# Patient Record
Sex: Female | Born: 1965 | Race: Black or African American | Hispanic: No | Smoking: Never smoker
Health system: Southern US, Community
[De-identification: ages and names within clinical notes are randomized; demographics above are authoritative.]

## PROBLEM LIST (undated history)

## (undated) DIAGNOSIS — F419 Anxiety disorder, unspecified: Secondary | ICD-10-CM

## (undated) DIAGNOSIS — I340 Nonrheumatic mitral (valve) insufficiency: Secondary | ICD-10-CM

## (undated) DIAGNOSIS — R002 Palpitations: Secondary | ICD-10-CM

## (undated) HISTORY — DX: Palpitations: R00.2

## (undated) HISTORY — DX: Nonrheumatic mitral (valve) insufficiency: I34.0

## (undated) HISTORY — DX: Anxiety disorder, unspecified: F41.9

## (undated) HISTORY — PX: AUGMENTATION MAMMAPLASTY: SUR837

---

## 2004-06-30 ENCOUNTER — Inpatient Hospital Stay (HOSPITAL_COMMUNITY): Admission: AD | Admit: 2004-06-30 | Discharge: 2004-07-02 | Payer: Self-pay | Admitting: Obstetrics and Gynecology

## 2004-10-19 ENCOUNTER — Other Ambulatory Visit: Admission: RE | Admit: 2004-10-19 | Discharge: 2004-10-19 | Payer: Self-pay | Admitting: Obstetrics and Gynecology

## 2007-08-22 ENCOUNTER — Observation Stay (HOSPITAL_COMMUNITY): Admission: EM | Admit: 2007-08-22 | Discharge: 2007-08-23 | Payer: Self-pay | Admitting: Emergency Medicine

## 2007-08-22 ENCOUNTER — Encounter (INDEPENDENT_AMBULATORY_CARE_PROVIDER_SITE_OTHER): Payer: Self-pay | Admitting: Internal Medicine

## 2007-09-10 ENCOUNTER — Emergency Department (HOSPITAL_COMMUNITY): Admission: EM | Admit: 2007-09-10 | Discharge: 2007-09-10 | Payer: Self-pay | Admitting: Emergency Medicine

## 2007-09-15 ENCOUNTER — Encounter (INDEPENDENT_AMBULATORY_CARE_PROVIDER_SITE_OTHER): Payer: Self-pay | Admitting: Family Medicine

## 2007-09-15 ENCOUNTER — Ambulatory Visit: Payer: Self-pay

## 2007-12-01 ENCOUNTER — Other Ambulatory Visit: Admission: RE | Admit: 2007-12-01 | Discharge: 2007-12-01 | Payer: Self-pay | Admitting: Obstetrics and Gynecology

## 2008-05-04 ENCOUNTER — Encounter: Admission: RE | Admit: 2008-05-04 | Discharge: 2008-05-04 | Payer: Self-pay | Admitting: Obstetrics and Gynecology

## 2009-05-10 ENCOUNTER — Other Ambulatory Visit: Admission: RE | Admit: 2009-05-10 | Discharge: 2009-05-10 | Payer: Self-pay | Admitting: Obstetrics and Gynecology

## 2009-08-23 ENCOUNTER — Encounter: Admission: RE | Admit: 2009-08-23 | Discharge: 2009-08-23 | Payer: Self-pay | Admitting: Family Medicine

## 2009-08-25 ENCOUNTER — Encounter: Admission: RE | Admit: 2009-08-25 | Discharge: 2009-08-25 | Payer: Self-pay | Admitting: Obstetrics and Gynecology

## 2009-08-31 ENCOUNTER — Encounter: Admission: RE | Admit: 2009-08-31 | Discharge: 2009-08-31 | Payer: Self-pay | Admitting: Obstetrics and Gynecology

## 2009-09-11 ENCOUNTER — Telehealth (INDEPENDENT_AMBULATORY_CARE_PROVIDER_SITE_OTHER): Payer: Self-pay | Admitting: *Deleted

## 2009-09-12 ENCOUNTER — Ambulatory Visit: Payer: Self-pay | Admitting: Pulmonary Disease

## 2009-09-12 DIAGNOSIS — F411 Generalized anxiety disorder: Secondary | ICD-10-CM

## 2009-09-12 DIAGNOSIS — G4700 Insomnia, unspecified: Secondary | ICD-10-CM | POA: Insufficient documentation

## 2010-02-20 ENCOUNTER — Ambulatory Visit: Payer: Self-pay | Admitting: Cardiovascular Disease

## 2010-03-21 ENCOUNTER — Ambulatory Visit: Payer: Self-pay

## 2010-03-21 ENCOUNTER — Ambulatory Visit (HOSPITAL_COMMUNITY): Admission: RE | Admit: 2010-03-21 | Discharge: 2010-03-21 | Payer: Self-pay | Admitting: Cardiovascular Disease

## 2010-03-21 ENCOUNTER — Encounter: Payer: Self-pay | Admitting: Cardiovascular Disease

## 2010-03-21 ENCOUNTER — Ambulatory Visit: Payer: Self-pay | Admitting: Cardiology

## 2010-06-12 NOTE — Progress Notes (Signed)
Summary: FYI RE: NEW CONSULT SCHED'D FOR TOMORROW  Phone Note Call from Patient Call back at Home Phone [phone]   Caller: Patient Call For: [facility] Summary of Call: Grisel Blumenstock, THIS WOMAN CALLED TO SCHEDULE THE FIRST AVAIL. CONSULT FOR SLEEP. SHE IS SCHEDULED TO SEE KC TOMORROW. I ASKED IF SHE COULD CALL HER PCP AND HAVE OV/ HX, ETC FAXED TO [place]. SHE SAYS THAT SHE SEES DR Clyde Canterbury AT [facility] ON [address]. HOWEVER, SHE DIDN'T WANT TO CALL THEM AS SHE SAYS HER DR DIDN'T THINK SHE NEEDS TO SEE A SLEEP SPECIALIST; THINKS HER SLEEP ISSUES ARE "ALL IN HER HEAD". JUST AN FYI FOR YOU AND KC. PT SAYS THAT SHE WAKES UP DURING THE NIGHT WITH HER HEART "RACING" AND HAS TROUBLE GETTING BACK TO SLEEP.   Initial call taken by: Tivis Ringer, CNA,  Sep 11, 2009 3:27 PM  Follow-up for Phone Call        noted.  will request records.  Arman Filter LPN  Sep 12, 979 3:53 PM

## 2010-06-12 NOTE — Assessment & Plan Note (Signed)
Summary: self referral for poor sleep hygeine, insomnia   Primary Provider/Referring Provider:  Dr. Clyde Canterbury  CC:  self referral.  History of Present Illness: the pt is a 45y/o female who comes in today for evaluation of sleeping issues.  She has had issues with sleep onset and maintenance for at least 2 years, and feels it is worsening.  She typically goes to bed btw 10pm-66mn, and hesitates to go to bed even when she is sleepy due to frustration of initiating sleep.  She uses a computer late at night to do work, and uses laptop even while in bed!!.  She also notes that her husband watches tv in bed late into the night, and gets up a lot to go to bathroom and to eat/drink.  The pt also notes chronic neck/shoulder/back pain at night that interferes with sleep as well.  She can typically get to sleep within one hour of trying, but awakens most nights within one hour of going to sleep.  She then is awake anywhere from to hours.  When asked why she thinks she cannot go back to sleep, she comments "body goes into anxiety".  She typically will stay in bed if she cannot re-initiate sleep.  She denies any snoring or RLS symptoms.  She does note significant sleep pressure during the day, and sometimes takes naps.  Her epworth score today is 11.  She drinks one cup of coffee a day, and has caffeinated soda for lunch.  She states that she may have caffeine as late as 8pm.  Current Medications (verified): 1)  Clonazepam 0.5 Mg Tbdp (Clonazepam) .... 1/2 Tab As Needed 2)  Omeprazole 40 Mg Cpdr (Omeprazole) .... As Needed  Allergies (verified): No Known Drug Allergies  Past History:  Past Medical History: ANXIETY (ICD-300.00)  Past Surgical History: none  Family History: Reviewed history and no changes required. 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  The patient denies shortness of breath with activity, productive cough, non-productive cough, coughing up blood, chest pain, irregular heartbeats, acid heartburn, indigestion, loss of appetite, weight change, abdominal pain, difficulty swallowing, sore throat, tooth/dental problems, headaches, nasal congestion/difficulty breathing through nose, sneezing, itching, ear ache, depression, joint stiffness or pain, rash, change in color of mucus, and fever.    Vital Signs:  Patient profile:   45 year old female Height:      66 inches Weight:      166.13 pounds BMI:     26.91 O2 Sat:      98 % on Room air Temp:     98.4 degrees F oral Pulse rate:   87 / minute BP sitting:   106 / 60  (right arm) Cuff size:   regular  Vitals Entered By: Gweneth Dimitri RN (Sep 12, 2009 9:24 AM)  O2 Flow:  Room air CC: self referral Comments Medications reviewed with patient Daytime contact number verified with patient. Gweneth Dimitri RN  Sep 12, 2009 9:24 AM    Physical Exam  General:  wd female in nad Eyes:  PERRLA and EOMI.   Nose:  turbinate hypertrophy, but patent Mouth:  clear, normal u/p. Neck:  no jvd, tmg, LN Lungs:  totally clear to auscultation Heart:  rrr, no mrg Abdomen:  soft and nontender, bs+ Extremities:  no edema  or cyanosis, pulses intact distally Neurologic:  appears very sleepy, but oriented, moves all four.   Impression & Recommendations:  Problem # 1:  PERSISTENT DISORDER INITIATING/MAINTAINING SLEEP (ICD-307.42)  the pt's main issue is poor sleep hygiene and environmental issues.  I suspect she does not have a nocturnal sleep disorder.  I have had a long discussion with her about going to bed when first getting sleepy, not using computer after 10pm, considering sleeping in a different bedroom if husband disrupts sleep that much, no caffeine after 10am, and finally discussed stimulus control  therapy with her.  I also explained the role of chronic pain in sleep disruption.  She is to work on these things for the next 3 weeks, and let me know of her progress.  Medications Added to Medication List This Visit: 1)  Clonazepam 0.5 Mg Tbdp (Clonazepam) .... 1/2 tab as needed 2)  Omeprazole 40 Mg Cpdr (Omeprazole) .... As needed  Other Orders: New Patient Level IV (29562)  Patient Instructions: 1)  when you get sleepy, go to bed. 2)  no computer after 10pm, and NEVER in bedroom. 3)  talk with primary md about your chronic pain 4)  try sleeping in another room for next 2-3 weeks to avoid sleep disturbance by husband. 5)  no tv watching or reading in bedroom 6)  if you cannot initiate sleep within , leave bedroom to family room until sleepy again 7)  no napping during day 8)  NO caffeine after 10am. 9)  call me in 3 weeks with progress.

## 2010-06-14 ENCOUNTER — Other Ambulatory Visit: Payer: Self-pay | Admitting: Obstetrics and Gynecology

## 2010-06-14 ENCOUNTER — Other Ambulatory Visit (HOSPITAL_COMMUNITY)
Admission: RE | Admit: 2010-06-14 | Discharge: 2010-06-14 | Disposition: A | Discharge status: Home / Self Care | Payer: Private Health Insurance - Indemnity | Source: Ambulatory Visit | Attending: Obstetrics and Gynecology | Admitting: Obstetrics and Gynecology

## 2010-06-14 DIAGNOSIS — Z01419 Encounter for gynecological examination (general) (routine) without abnormal findings: Secondary | ICD-10-CM | POA: Insufficient documentation

## 2010-06-14 DIAGNOSIS — Z113 Encounter for screening for infections with a predominantly sexual mode of transmission: Secondary | ICD-10-CM | POA: Insufficient documentation

## 2010-09-25 NOTE — H&P (Signed)
Rebekah Bates, Rebekah Bates                ACCOUNT NO.:  [id number]   MEDICAL RECORD NO.:  [id number]          PATIENT TYPE:  INP   LOCATION:  0102                         FACILITY[facility]  [facility]   PHYSICIAN:  Della Goo, M.D. DATE OF BIRTH:  [birth year]   DATE OF ADMISSION:  08/22/2007  DATE OF DISCHARGE:                              HISTORY & PHYSICAL   PRIMARY CARE PHYSICIAN:  Unassigned.   CHIEF COMPLAINT:  Chest pain.   HISTORY OF PRESENT ILLNESS:  This is a 45 year old female who presents  to the emergency department with complaints of chest pain off and on for  1 month.  She reports the pain is located under the left breast area and  comes and goes and is worse with exertion.  She reports this a.m. having  pain that awakened her from sleep.  This pain was described as being  chest pressure in the left chest and under the left breast and she rated  the discomfort as being a 4-5/10 at the worst.  She denies having any  shortness of breath, nausea, vomiting, diarrhea or diaphoresis.  She  denies having any cough, fevers, chills..  The patient reports being  relatively healthy, is a nonsmoker, nondrinker, and reports exercising  regularly.  She also reports eating very healthy.   PAST MEDICAL HISTORY:  None.   PAST SURGICAL HISTORY:  None.   MEDICATIONS:  Over-the-counter medications, Tums and iron.   ALLERGIES:  No known drug allergies.   SOCIAL HISTORY:  The patient is a nonsmoker, nondrinker.   FAMILY HISTORY:  Negative for coronary artery disease or diabetes but  positive for hypertension in her maternal grandfather.  No history of  cancer.   REVIEW OF SYSTEMS:  Pertinents are mentioned above.   PHYSICAL EXAMINATION FINDINGS:  A pleasant 45 year old well-nourished,  well-developed female in no acute distress or discomfort at this time.  VITAL SIGNS:  Temperature 98.1, blood pressure 112/61, heart rate 58,  respirations 20, O2 saturations 99-100% on room air.  HEENT:   Normocephalic, atraumatic.  Pupils equally round, reactive to  light.  Extraocular muscles are intact.  Funduscopic benign.  There is  no scleral icterus.  Oropharynx is clear.  NECK:  Supple, full range of motion.  No thyromegaly, adenopathy or  jugular venous distention.  CARDIOVASCULAR:  Bradycardic rate, regular rhythm.  CHEST WALL:  No reproducible chest wall tenderness.  ABDOMEN:  Positive bowel sounds, soft, nontender, nondistended.  No  hepatosplenomegaly.  No rebound.  No guarding.  EXTREMITIES:  Without cyanosis, clubbing or edema.  BACK:  No costovertebral angle tenderness.  No spinous process  tenderness.  EXTREMITIES:  Without cyanosis, clubbing or edema.  NEUROLOGIC:  Alert and oriented x3.  There are no focal deficits.   LABORATORY STUDIES:  White blood cell count 5.5, hemoglobin 12.7,  hematocrit 38.5, platelets 227, neutrophils 32%, lymphocytes 55%.  Sodium 139, potassium 3.9, chloride 107, bicarb 24, BUN 7, creatinine  0.94, and glucose 85.  Calcium level 9.7.  Cardiac enzymes with a  myoglobin of 38.9, CK-MB 1.6, troponin less than 0.05.  EKG reveals a  sinus bradycardia.  No acute ST-segment changes are seen.  Chest x-ray  findings reveal no acute disease process.   ASSESSMENT:  A 45 year old female being admitted with:   1. Chest pain.  2. Sinus bradycardia probably secondary to increased athletic tone.  3. Acute versus chronic viral syndrome.   PLAN:  The patient will be admitted to a telemetry area for cardiac  monitoring.  Cardiac enzymes will be performed.  The patient will be  placed on nitroglycerin paste topically and oxygen therapy along with  aspirin therapy.  The patient is bradycardic at this time so beta  blocker therapy will not be started.  DVT and GI prophylaxis have been  ordered.  A 2-D echo has also been ordered to evaluate for wall  thickness abnormalities and valvular disease.  The patient's CBC will  also be monitored for further  fluctuations in her white blood cell count  and differential.  Further workup will ensue pending the patient's  symptoms and clinical progress.      Della Goo, M.D.  Electronically Signed     HJ/MEDQ  D:  08/22/2007  T:  08/22/2007  Job:  [id number]

## 2010-09-25 NOTE — Discharge Summary (Signed)
Bates, Rebekah                ACCOUNT NO.:  [id number]   MEDICAL RECORD NO.:  [id number]          PATIENT TYPE:  INP   LOCATION:  1404                         [facility]  [facility]   PHYSICIAN:  Eduard Clos, MDDATE OF BIRTH:  1965-10-11   DATE OF ADMISSION:  08/22/2007  DATE OF DISCHARGE:  08/23/2007                               DISCHARGE SUMMARY   HOSPITAL COURSE:  A 45 year old female in the [employer] with a past  medical history of preoperative chest pain.  The patient was admitted to  a telemetry floor.  Serial cardiac enzymes and EKGs were done which were  within normal and it was decided to admit the patient's chest pain which  have resolved .  And at this moment the patient is eager to go home.   Advised the patient that she will need outpatient stress test and a 2D  echo for which she has agreed to follow up with [facility].   At the time of discharge the patient is hemodynamically stable.   DISCHARGE DIAGNOSIS:  Chest pain.   DISCHARGE MEDICATIONS:  None.   PLAN:  The patient was advised to follow with the primary care physician  in a weeks time, to follow up with [facility] in a week's time  and have a 2D echo and stress test done.  In the event of any recurrent  symptoms, the patient is advised to go to ____________.      Eduard Clos, MD  Electronically Signed     ANK/MEDQ  D:  08/23/2007  T:  08/23/2007  Job:  [id number]

## 2010-09-28 NOTE — H&P (Signed)
NAMECLOYCE, Rebekah Bates                ACCOUNT NO.:  [id number]   MEDICAL RECORD NO.:  [id number]          PATIENT TYPE:  INP   LOCATION:  9165                          FACILITY:  [facility]   PHYSICIAN:  Juan H. Lily Peer, M.D.DATE OF BIRTH:  [birth year]   DATE OF ADMISSION:  06/30/2004  DATE OF DISCHARGE:                                HISTORY & PHYSICAL   CHIEF COMPLAINT:  Contractions.   HISTORY OF PRESENT ILLNESS:  The patient is a 45 year old, gravida 3, para  2, with an estimated date of confinement based on early ultrasound to be  July 06, 2004, currently 39-1/7th weeks gestation. She presented to  [facility] complaining of contraction. This morning, she stated that  her contractions actually started around 4:30 this morning. When she  presented to [facility], she was found to be about 2 cm, about 80%, -3  station with irregular contractions. She was monitored for a couple of hours  and her cervix changed from 2 to 5 cm and about 80-90% effaced, and  contractions were more regular every three to four minutes apart with a  reassuring tracing. She was brought in to labor and delivery.   Upon examination, her cervix is now 6 to 7 cm, bulging membrane. She  underwent artificial rupture of membrane. After Pen-G had been started, due  to the fact that she had a history of positive group B strep culture  according to the patient, although documentation could not be found at this  time. So, we took the precaution of starting her on that and an intrauterine  pressure catheter was placed. The amniotic fluid was clear.   PRENATAL COURSE HISTORY:  Significant for the fact that she had declined  genetic amniocentesis due to the fact that she is 45 years of age and she  also did not have alphafetaprotein or cystic fibrosis screen. Otherwise,  uneventful prenatal course. She did not pass her diabetes screen but passed  her three hour GTT.   ALLERGIES:  The patient denies any  allergies.   PAST MEDICAL HISTORY:  She has had two prior vaginal deliveries at term at  [redacted] weeks gestation.   REVIEW OF SYMPTOMS:  See [place] form.   PHYSICAL EXAMINATION:  VITAL SIGNS:  Temperature 98.2, blood pressure  126/72. Contractions every two to four minutes apart with a reassuring fetal  heart rate tracing.  GENERAL:  Well-developed, well-nourished female complaining of contractions.  HEENT:  Unremarkable.  NECK:  Supple. Trachea midline. No carotid bruits. No thyromegaly.  LUNGS:  Clear to auscultation without rhonchi or wheezes.  HEART:  Regular rate and rhythm. No murmurs or gallops.  BREASTS:  Not done.  ABDOMEN:  Gravid uterus.  PELVIC:  Vertex presentation by Thayer Ohm maneuver. Confirmed by pelvic  examination. Cervix 6 to 7 cm dilated, 90% effaced, -2 station. Clear  amniotic fluid upon rupture. Scalp full internal place.  EXTREMITIES:  There is 1+ pitting edema. DTR 1+. Negative clonus.   LABORATORY DATA:  See [place]ister form.   ASSESSMENT:  A 45 year old, gravida 3, para 2, at 39-1/7th week gestation in  active labor with advanced cervical dilatation. Underwent artificial rupture  of membrane after Pen-G had been started due to her past history of group B  strep. The patient fully internalized, does not want an epidural. We will  augment with Pitocin in the event of protracted labor. Reassuring fetal  heart rate tracing.   PLAN:  As per assessment above. Anticipate vaginal delivery.      JHF/MEDQ  D:  06/30/2004  T:  06/30/2004  Job:  [id number]   cc:   Leonette Most A. Sydnee Cabal, MD  Fax: [phone]

## 2011-02-05 LAB — DIFFERENTIAL
Basophils Absolute: 0.1
Basophils Relative: 1
Basophils Relative: 1
Eosinophils Absolute: 0.2
Eosinophils Relative: 4
Lymphocytes Relative: 55 — ABNORMAL HIGH
Monocytes Absolute: 0.5
Monocytes Relative: 9
Neutrophils Relative %: 32 — ABNORMAL LOW
Neutrophils Relative %: 51

## 2011-02-05 LAB — CARDIAC PANEL(CRET KIN+CKTOT+MB+TROPI)
CK, MB: 1.5
Relative Index: 1.1
Troponin I: 0.02
Troponin I: 0.02

## 2011-02-05 LAB — BASIC METABOLIC PANEL
BUN: 7
CO2: 24
Calcium: 9.7
Chloride: 107
Creatinine, Ser: 0.94
GFR calc Af Amer: >60
GFR calc non Af Amer: >60
Glucose, Bld: 76
Sodium: 139

## 2011-02-05 LAB — CBC
HCT: 38.5
Hemoglobin: 12.7
Hemoglobin: 13.2
MCHC: 32.9
MCHC: 32.5
MCV: 80.9
RBC: 5.03
WBC: 5.4

## 2011-02-05 LAB — TSH: TSH: 1.338

## 2011-02-05 LAB — URINALYSIS, ROUTINE W REFLEX MICROSCOPIC
Glucose, UA: NEGATIVE
Hgb urine dipstick: NEGATIVE
Ketones, ur: NEGATIVE
Nitrite: NEGATIVE
Specific Gravity, Urine: 1.009
pH: 7

## 2011-02-05 LAB — POCT CARDIAC MARKERS: Myoglobin, poc: 45.7

## 2012-05-08 ENCOUNTER — Other Ambulatory Visit: Payer: Self-pay | Admitting: Family Medicine

## 2012-05-08 DIAGNOSIS — Z1231 Encounter for screening mammogram for malignant neoplasm of breast: Secondary | ICD-10-CM

## 2012-06-02 ENCOUNTER — Ambulatory Visit
Admission: RE | Admit: 2012-06-02 | Discharge: 2012-06-02 | Disposition: A | Discharge status: Home / Self Care | Payer: BC Managed Care – PPO | Source: Ambulatory Visit | Attending: Family Medicine | Admitting: Family Medicine

## 2012-06-02 DIAGNOSIS — Z1231 Encounter for screening mammogram for malignant neoplasm of breast: Secondary | ICD-10-CM

## 2012-06-04 ENCOUNTER — Other Ambulatory Visit (HOSPITAL_COMMUNITY)
Admission: RE | Admit: 2012-06-04 | Discharge: 2012-06-04 | Disposition: A | Discharge status: Home / Self Care | Payer: BC Managed Care – PPO | Source: Ambulatory Visit | Attending: Obstetrics and Gynecology | Admitting: Obstetrics and Gynecology

## 2012-06-04 ENCOUNTER — Other Ambulatory Visit: Payer: Self-pay | Admitting: Nurse Practitioner

## 2012-06-04 DIAGNOSIS — N760 Acute vaginitis: Secondary | ICD-10-CM | POA: Insufficient documentation

## 2012-06-04 DIAGNOSIS — Z1151 Encounter for screening for human papillomavirus (HPV): Secondary | ICD-10-CM | POA: Insufficient documentation

## 2012-06-04 DIAGNOSIS — Z01419 Encounter for gynecological examination (general) (routine) without abnormal findings: Secondary | ICD-10-CM | POA: Insufficient documentation

## 2013-02-03 ENCOUNTER — Ambulatory Visit
Admission: RE | Admit: 2013-02-03 | Discharge: 2013-02-03 | Disposition: A | Discharge status: Home / Self Care | Payer: BC Managed Care – PPO | Source: Ambulatory Visit | Attending: Family Medicine | Admitting: Family Medicine

## 2013-02-03 ENCOUNTER — Other Ambulatory Visit: Payer: Self-pay | Admitting: Family Medicine

## 2013-02-03 DIAGNOSIS — M542 Cervicalgia: Secondary | ICD-10-CM

## 2013-02-03 DIAGNOSIS — M25552 Pain in left hip: Secondary | ICD-10-CM

## 2013-06-01 ENCOUNTER — Encounter: Payer: Self-pay | Admitting: Podiatry

## 2013-06-01 ENCOUNTER — Ambulatory Visit (INDEPENDENT_AMBULATORY_CARE_PROVIDER_SITE_OTHER): Payer: BC Managed Care – PPO | Admitting: Podiatry

## 2013-06-01 VITALS — BP 120/61 | HR 66 | Resp 17 | Ht 66.0 in | Wt 175.0 lb

## 2013-06-01 DIAGNOSIS — M7750 Other enthesopathy of unspecified foot: Secondary | ICD-10-CM

## 2013-06-01 DIAGNOSIS — M779 Enthesopathy, unspecified: Principal | ICD-10-CM

## 2013-06-01 DIAGNOSIS — M778 Other enthesopathies, not elsewhere classified: Secondary | ICD-10-CM

## 2013-06-01 NOTE — Progress Notes (Signed)
Pt states the right 1st MPJ continues to hurt, worse in enclosed shoes, and standing.  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  Objective: Vital signs are stable she is alert and oriented x3. She has pain on palpation medial aspect of the first metatarsophalangeal joint of the right foot. A small palpable bursa is noted.  Assessment: Hallux abductovalgus with bursitis medial aspect first metatarsophalangeal joint.  Plan: Discussed etiology pathology conservative versus surgical therapies injected today with dexamethasone and local anesthetic to the point of maximal tenderness after sterile Betadine skin prep. I will followup with her on as-needed basis for an injection. I did suggest that she wear wide open toed shoes for at least tennis shoes to work.

## 2013-07-05 ENCOUNTER — Ambulatory Visit: Payer: BC Managed Care – PPO | Admitting: Cardiovascular Disease

## 2013-07-20 ENCOUNTER — Other Ambulatory Visit: Payer: Self-pay | Admitting: Family Medicine

## 2013-07-20 DIAGNOSIS — M542 Cervicalgia: Secondary | ICD-10-CM

## 2013-07-26 ENCOUNTER — Ambulatory Visit
Admission: RE | Admit: 2013-07-26 | Discharge: 2013-07-26 | Disposition: A | Discharge status: Home / Self Care | Payer: BC Managed Care – PPO | Source: Ambulatory Visit | Attending: Family Medicine | Admitting: Family Medicine

## 2013-07-26 DIAGNOSIS — M542 Cervicalgia: Secondary | ICD-10-CM

## 2013-07-29 ENCOUNTER — Ambulatory Visit: Payer: BC Managed Care – PPO | Admitting: Cardiovascular Disease

## 2013-08-02 ENCOUNTER — Encounter: Payer: BC Managed Care – PPO | Admitting: Neurology

## 2013-08-30 ENCOUNTER — Encounter: Payer: Self-pay | Admitting: Cardiovascular Disease

## 2013-08-30 ENCOUNTER — Ambulatory Visit (INDEPENDENT_AMBULATORY_CARE_PROVIDER_SITE_OTHER): Payer: BC Managed Care – PPO | Admitting: Cardiovascular Disease

## 2013-08-30 VITALS — BP 110/80 | HR 72 | Ht 66.0 in | Wt 175.8 lb

## 2013-08-30 DIAGNOSIS — R002 Palpitations: Secondary | ICD-10-CM | POA: Insufficient documentation

## 2013-08-30 NOTE — Assessment & Plan Note (Signed)
Rebekah Bates presents today for further evaluation and management of her palpitations. A lot of her symptoms sound consistent with anxiety. Most of her cardiac issues sound like normal physiology. She may be having some premature ventricular contractions.  I've recommended that she increase her potassium intake including drinking V8 juice, eating more tomatoes, bananas, orange juice, and cannula. We'll also refer her to [facility] for some biofeedback therapy.  I'll see her again in 6 months.

## 2013-08-30 NOTE — Patient Instructions (Addendum)
You're having some palpitations at night. These are most likely benign. One possibility is that you're having premature ventricular contractions. Your  thyroid has already been checked and found to be normal. Another possibility is that you have low potassium. I would suggest increasing your intake of V-8 juice, bananas, tomatoes, potatoes, orange juice.  Call [facility] - [phone] -  Mind / body healing, biofeedback instructor.  Your physician wants you to follow-up in: 6 months with Dr. Elease HashimotoNahser.  You will receive a reminder letter in the mail two months in advance. If you don't receive a letter, please call our office to schedule the follow-up appointment.  Your physician recommends that you continue on your current medications as directed. Please refer to the Current Medication list given to you today.

## 2013-08-30 NOTE — Progress Notes (Signed)
     Rebekah Bates Date of Birth  1965/08/20       [facility]    [place] [facility] [address]  [address] [place], [place]  [address]   [place], [place]  [id number] [phone]     [phone]   Fax  [phone]     Fax [phone]  Problem List: 1. Mild mitral regurgitation 2. Palpitations  History of Present Illness:  Rebekah BattiestCharo was seen several years ago.    She has lots of noncardiac issues - many sound like anxiety She is exercising - 3-4 times a week.   Does well with exercise, no Cp or dyspnea.   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    She has palpitations at night.   She is not sleeping well.  Stays awake for lots of the night.    Current Outpatient Prescriptions on File Prior to Visit  Medication Sig Dispense Refill  . clonazePAM (KLONOPIN) 0.5 MG tablet Take 0.5 mg by mouth as directed.       No current facility-administered medications on file prior to visit.    No Known Allergies  Past Medical History  Diagnosis Date  . Palpitations   . Anxiety   . Mild mitral regurgitation     No past surgical history on file.  History  Smoking status  . Never Smoker   Smokeless tobacco  . Not on file    History  Alcohol Use: Not on file    Family History  Problem Relation Age of Onset  . Hypertension Mother   . Diabetes Father   . Hypertension Father     Reviw of Systems:  Reviewed in the HPI.  All other systems are negative.  Physical Exam: Blood pressure 110/80, pulse 72, height 5\' 6"  (1.676 m), weight 175 lb 12.8 oz (79.742 kg). Wt Readings from Last 3 Encounters:  08/30/13 175 lb 12.8 oz (79.742 kg)  06/01/13 175 lb (79.379 kg)  09/12/09 166 lb 2.1 oz (75.357 kg)     General: Well developed, well nourished, in no acute distress.  Head: Normocephalic, atraumatic, sclera non-icteric, mucus membranes are moist,   Neck: Supple. Carotids are 2 + without bruits. No JVD   Lungs: Clear   Heart: RR, no  significant murmur  Abdomen: Soft, non-tender, non-distended with normal bowel sounds  Msk:  Strength and tone are normal   Extremities: No clubbing or cyanosis. No edema.  Distal pedal pulses are 2+ and equal    Neuro: CN II - XII intact.  Alert and oriented X 3.   Psych:  Normal   ECG: 08/30/2013: Normal sinus rhythm at 72. She has no ST or T wave changes.  Assessment / Plan:

## 2013-12-27 ENCOUNTER — Telehealth: Payer: Self-pay | Admitting: Cardiovascular Disease

## 2013-12-27 NOTE — Telephone Encounter (Signed)
Ok to place a 30 day monitor.  Office visit in 3 months

## 2013-12-27 NOTE — Telephone Encounter (Signed)
New problem:    Per pt saw dr Leticia ClasHussan 8/16 and would like to be see ASAP or may be get a monitor that was suggested?  Pt will have Dr Darlin PriestlyHussan's office fax notes.   Pt would like a call back about the problems she has been having.

## 2013-12-27 NOTE — Telephone Encounter (Signed)
Pt called bach because she states her palpitations are  more frequent now. Pt states she had them during the night; now she has them during the day also. Pt states that she is taking the anxiety medication and she still  having the palpitations. Pt was seen by primary care today, and was recommended to call her cardiology MD to see if she can have a cardiac monitor. Pt is aware that a message will be send to MD for recommendations.

## 2013-12-27 NOTE — Telephone Encounter (Signed)
Left pt a message to call back. 

## 2013-12-29 NOTE — Telephone Encounter (Signed)
Patient aware of appointment dates and times and verbalized agreement and gratitude

## 2013-12-29 NOTE — Telephone Encounter (Signed)
Spoke with patient and advised that 30 day event monitor will be ordered for palpitations per Dr. Elease HashimotoNahser.  Patient agrees to plan.  We discussed dates/times for monitor and I was able to schedule patient for Friday 8/21.  I left message for patient to call back to confirm appointment.  Patient is scheduled to f/u with Dr. Elease HashimotoNahser 12/1.

## 2013-12-29 NOTE — Telephone Encounter (Signed)
Left message for patient to call me at the office today

## 2013-12-31 ENCOUNTER — Encounter (INDEPENDENT_AMBULATORY_CARE_PROVIDER_SITE_OTHER): Payer: BC Managed Care – PPO

## 2013-12-31 ENCOUNTER — Encounter: Payer: Self-pay | Admitting: Radiology

## 2013-12-31 ENCOUNTER — Other Ambulatory Visit: Payer: Self-pay | Admitting: Nurse Practitioner

## 2013-12-31 ENCOUNTER — Encounter: Payer: Self-pay | Admitting: Cardiovascular Disease

## 2013-12-31 DIAGNOSIS — R002 Palpitations: Secondary | ICD-10-CM

## 2013-12-31 NOTE — Progress Notes (Signed)
Patient ID: Rebekah PapCharo L Bates, female   DOB: [birth year], 48 y.o.   MRN: [id number] E cardio 30 day monitor applied EOS 01-30-14

## 2014-01-04 ENCOUNTER — Telehealth: Payer: Self-pay | Admitting: Nurse Practitioner

## 2014-01-04 MED ORDER — METOPROLOL SUCCINATE ER 25 MG PO TB24: 25.0000 mg | ORAL_TABLET | Freq: Every day | ORAL | Status: DC

## 2014-01-04 NOTE — Telephone Encounter (Signed)
Received eCardio report; per Dr. Elease Hashimoto patient having sinus tachycardia and he would like her to start Toprol XL 25 mg once daily.  I called and spoke with patient and advised her of Dr. Harvie Bridge recommendation.  Patient verbalized understanding and agreement.

## 2014-01-10 ENCOUNTER — Telehealth: Payer: Self-pay | Admitting: Cardiovascular Disease

## 2014-01-10 DIAGNOSIS — R002 Palpitations: Secondary | ICD-10-CM

## 2014-01-10 NOTE — Telephone Encounter (Signed)
New message      Patient want doctor to know she was working out wed or thurs (whatever day we called her saying her heart rate was up).  Someone called her and said her heart rate was high.  Since she was working out, she want to make sure medication is needed.  Please call

## 2014-01-10 NOTE — Telephone Encounter (Signed)
Pt calling to ask Dr Elease Hashimoto and nurse if its necessary that she be on the Toprol xl 25 mg daily that was recently started on 8/25.  Pt states she was recently started on Toprol xl 25 mg because of recent monitor notification showing sinus tach.  Pt states she was exercising at the time of the recorded event.  Pt just wants reassurance that she needs to take the Toprol xl 25 mg.  Pt states she does experience periods of "fluttering sensations" in her chest, but that's nothing abnormal.  Pt states she is asymptomatic at this time with no sob, or cp.  Pt would also like Dr Elease Hashimoto to advise if its safe for her to start a exercise regimen with a personal trainer.  Informed pt that Dr Elease Hashimoto and nurse are both out of the office today, but I will route this message to them for further review and recommendation and follow-up thereafter.  Pt verbalized understanding and agrees with this plan.

## 2014-01-11 NOTE — Telephone Encounter (Signed)
She may hold the Toprol if she feels that the episode of tachycardia was just due to exercise,  We can reassess in the future.

## 2014-01-12 ENCOUNTER — Other Ambulatory Visit (INDEPENDENT_AMBULATORY_CARE_PROVIDER_SITE_OTHER): Payer: BC Managed Care – PPO

## 2014-01-12 DIAGNOSIS — R002 Palpitations: Secondary | ICD-10-CM

## 2014-01-12 LAB — BASIC METABOLIC PANEL
BUN: 8 mg/dL (ref 6–23)
CALCIUM: 9.1 mg/dL (ref 8.4–10.5)
CO2: 29 mEq/L (ref 19–32)
CREATININE: 0.8 mg/dL (ref 0.4–1.2)
Chloride: 105 mEq/L (ref 96–112)
GFR: 95.25 mL/min (ref >60.00)
Glucose, Bld: 108 mg/dL — ABNORMAL HIGH (ref 70–99)
Potassium: 3.5 mEq/L (ref 3.5–5.1)
SODIUM: 139 meq/L (ref 135–145)

## 2014-01-12 NOTE — Telephone Encounter (Signed)
Left message for patient to call the office for advice about Toprol

## 2014-01-12 NOTE — Telephone Encounter (Signed)
Received call from patient who states she has been taking Toprol since 8/31.  I discussed Dr. Harvie Bridge advice with her and she states she is concerned about stopping Toprol abruptly.  I advised that patient may take 1/2 tab for a few days and then discontinue.  Patient states she is not certain as to whether the medication has helped with her symptoms - states she feels that exercise increases palpitations.  I advised that it is safe to continue medication, especially as she indicates that she is starting to exercise more frequently.  I advised patient to make certain she remains well hydrated as dehydration can adversely affect symptoms and we discussed diet.  Patient states she is unable to eat/drink many of the potassium-rich foods recommended by Dr. Elease Hashimoto at [facility] in April 2015.  I advised that we need to check a basic metabolic panel for hypokalemia.  Patient scheduled for lab on 9/3.  Patient verbalized understanding and agreement.

## 2014-01-13 ENCOUNTER — Other Ambulatory Visit: Payer: BC Managed Care – PPO

## 2014-01-14 ENCOUNTER — Telehealth: Payer: Self-pay | Admitting: Cardiovascular Disease

## 2014-01-14 NOTE — Telephone Encounter (Signed)
New problem   Pt is having allergic reaction from her Metoprolol and can't wear her lead to her monitor. Please call pt.

## 2014-01-14 NOTE — Telephone Encounter (Signed)
Calling stating that she thinks she is having a reaction to Metoprolol.  States she is having itching around electrodes from monitor she is wearing.  States the itching only started when she started on Metoprolol.  Advised she should call [employer] and have them send her "sensitive electrodes".  Do not feel that Metoprolol is related to electrodes.  Advised to take some benadryl or try using hydrocortisone cream.  She will call [employer] and continue taking Metoprolol as prescribed. She is using calamine lotion for itching. Will call if she has any further problems.

## 2014-01-19 ENCOUNTER — Telehealth: Payer: Self-pay | Admitting: *Deleted

## 2014-01-19 NOTE — Telephone Encounter (Signed)
The monitor shows sinus tach.  She was exercising at the time

## 2014-01-19 NOTE — Telephone Encounter (Signed)
[facility] called triage with serious notification on pts event monitor showing sinus tach at 160-165 bpm.  Contacted the pt and she states she was exercising on the treadmill at the time of recorded event.  Pt denies any cardiac complaints at this time.  [facility] is aware of this as well.  Will forward this to Dr Elease Hashimoto and nurse for their review.

## 2014-01-21 ENCOUNTER — Other Ambulatory Visit: Payer: Self-pay

## 2014-01-21 DIAGNOSIS — Z1231 Encounter for screening mammogram for malignant neoplasm of breast: Secondary | ICD-10-CM

## 2014-02-01 ENCOUNTER — Other Ambulatory Visit: Payer: Self-pay

## 2014-02-01 ENCOUNTER — Ambulatory Visit
Admission: RE | Admit: 2014-02-01 | Discharge: 2014-02-01 | Disposition: A | Discharge status: Home / Self Care | Payer: BC Managed Care – PPO | Source: Ambulatory Visit

## 2014-02-01 DIAGNOSIS — Z1231 Encounter for screening mammogram for malignant neoplasm of breast: Secondary | ICD-10-CM

## 2014-02-03 ENCOUNTER — Telehealth: Payer: Self-pay | Admitting: Nurse Practitioner

## 2014-02-03 NOTE — Telephone Encounter (Signed)
Per Dr. Elease Hashimoto, [employer] monitor results reveal NSR and sinus tachycardia.  I left message for patient to call office for results

## 2014-02-03 NOTE — Telephone Encounter (Signed)
Pt is aware  of [employer] monitor results. She verbalized understanding.

## 2014-02-03 NOTE — Telephone Encounter (Signed)
Follow up ° ° ° ° ° ° ° ° ° °Pt returning nurse call  °

## 2014-03-03 ENCOUNTER — Ambulatory Visit: Payer: BC Managed Care – PPO | Admitting: Cardiovascular Disease

## 2014-04-12 ENCOUNTER — Ambulatory Visit: Payer: BC Managed Care – PPO | Admitting: Cardiovascular Disease

## 2014-04-16 ENCOUNTER — Encounter (HOSPITAL_BASED_OUTPATIENT_CLINIC_OR_DEPARTMENT_OTHER): Payer: Self-pay | Admitting: *Deleted

## 2014-04-16 ENCOUNTER — Emergency Department (HOSPITAL_BASED_OUTPATIENT_CLINIC_OR_DEPARTMENT_OTHER)
Admission: EM | Admit: 2014-04-16 | Discharge: 2014-04-16 | Disposition: A | Discharge status: Home / Self Care | Payer: 59 | Attending: Emergency Medicine | Admitting: Emergency Medicine

## 2014-04-16 DIAGNOSIS — Z79899 Other long term (current) drug therapy: Secondary | ICD-10-CM | POA: Diagnosis not present

## 2014-04-16 DIAGNOSIS — J029 Acute pharyngitis, unspecified: Secondary | ICD-10-CM | POA: Diagnosis not present

## 2014-04-16 DIAGNOSIS — F419 Anxiety disorder, unspecified: Secondary | ICD-10-CM | POA: Insufficient documentation

## 2014-04-16 DIAGNOSIS — Z791 Long term (current) use of non-steroidal anti-inflammatories (NSAID): Secondary | ICD-10-CM | POA: Insufficient documentation

## 2014-04-16 DIAGNOSIS — Z8679 Personal history of other diseases of the circulatory system: Secondary | ICD-10-CM | POA: Insufficient documentation

## 2014-04-16 DIAGNOSIS — J069 Acute upper respiratory infection, unspecified: Secondary | ICD-10-CM

## 2014-04-16 DIAGNOSIS — R040 Epistaxis: Secondary | ICD-10-CM | POA: Diagnosis present

## 2014-04-16 NOTE — ED Provider Notes (Signed)
CSN: [id number]     Arrival date & time 04/16/14  0901 History   First MD Initiated Contact with Patient 04/16/14 [phone]     Chief Complaint  Patient presents with  . Epistaxis     (Consider location/radiation/quality/duration/timing/severity/associated sxs/prior Treatment) HPI  48 year old female presents with concerns for finding blood when she was spitting this morning after she woke up. About one hour ago she went to spit and nursing care after gargling like normal nose through with blood. She's been having a lot of nasal drainage over the past 1 week. She's been having no fevers but has been having sinus and chest congestion, dry cough, and postnasal drip. Today was the first time she knows any blood. She denies any coughing with hemoptysis or nausea or hematemesis. She has not noticed any epistaxis. The patient states that the blood has improved and has not spinning anymore blood up now. She's not on any blood thinners. She is on diclofenac for chronic left-sided neck pain. Yesterday she knows that her right side is been hurting more which is atypical for her. Patient denies any current weakness or numbness.  Past Medical History  Diagnosis Date  . Palpitations   . Anxiety   . Mild mitral regurgitation    History reviewed. No pertinent past surgical history. Family History  Problem Relation Age of Onset  . Hypertension Mother   . Diabetes Father   . Hypertension Father    History  Substance Use Topics  . Smoking status: Never Smoker   . Smokeless tobacco: Not on file  . Alcohol Use: No   OB History    No data available     Review of Systems  Constitutional: Negative for fever.  HENT: Positive for congestion and postnasal drip. Negative for sore throat.   Respiratory: Positive for cough. Negative for shortness of breath.   Gastrointestinal: Negative for nausea, vomiting and abdominal pain.  Musculoskeletal: Positive for neck pain.  Neurological: Negative for dizziness.   Psychiatric/Behavioral: The patient is nervous/anxious.   All other systems reviewed and are negative.     Allergies  Review of patient's allergies indicates no known allergies.  Home Medications   Prior to Admission medications   Medication Sig Start Date End Date Taking? Authorizing Provider  clonazePAM (KLONOPIN) 0.5 MG tablet Take 0.5 mg by mouth as directed.    Historical Provider, MD  cyclobenzaprine (FLEXERIL) 10 MG tablet Take 10 mg by mouth 3 (three) times daily as needed for muscle spasms.    Historical Provider, MD  diclofenac (VOLTAREN) 25 MG EC tablet Take 25 mg by mouth 2 (two) times daily.    Historical Provider, MD  doxycycline (DORYX) 100 MG EC tablet Take 100 mg by mouth as needed.     Historical Provider, MD  metoprolol succinate (TOPROL XL) 25 MG 24 hr tablet Take 1 tablet (25 mg total) by mouth daily. 01/04/14   Deloris PingPhilip J [name], MD   BP 120/60 mmHg  Pulse 77  Temp(Src) 98 F (36.7 C) (Oral)  Resp 20  SpO2 100% Physical Exam  Constitutional: She is oriented to person, place, and time. She appears well-developed and well-nourished.  HENT:  Head: Normocephalic and atraumatic.  Right Ear: External ear normal.  Left Ear: External ear normal.  Nose: Nose normal. No nasal septal hematoma. No epistaxis. Right sinus exhibits no maxillary sinus tenderness and no frontal sinus tenderness. Left sinus exhibits no maxillary sinus tenderness and no frontal sinus tenderness.  Mouth/Throat: Uvula is midline and  mucous membranes are normal. No oropharyngeal exudate, posterior oropharyngeal edema, posterior oropharyngeal erythema or tonsillar abscesses.  Eyes: Right eye exhibits no discharge. Left eye exhibits no discharge.  Neck: Normal range of motion. Neck supple.  No focal tenderness over left or right neck/trapezius  Cardiovascular: Normal rate, regular rhythm and normal heart sounds.   Pulmonary/Chest: Effort normal and breath sounds normal.  Abdominal: Soft. She  exhibits no distension. There is no tenderness.  Neurological: She is alert and oriented to person, place, and time.  Normal strength and gross sensation in all 4 extremities. Normal gait.  Skin: Skin is warm and dry. She is not diaphoretic.  Nursing note and vitals reviewed.   ED Course  Procedures (including critical care time) Labs Review Labs Reviewed - No data to display  Imaging Review No results found.   EKG Interpretation None      MDM   Final diagnoses:  Upper respiratory infection    Patient is having viral upper respiratory infectious symptoms as well as postnasal drip that now appears to have some blood. There is no blood in her nares or oropharynx on my exam. There is no evidence that this is hemoptysis or hematemesis on her history. The patient's vital signs are within normal limits. The patient is not having any chest pain or dizziness. She does appear anxious, and I believe that her symptoms are likely from having dry membranes overnight and then having blood in her congestion this morning. She occasionally feels the blood running down the back of her throat but I do not see evidence of this now. I provided reassurance, and discussed return precautions, such as worsening blood, hemoptysis, or hematemesis. Given her normal vital signs and exam and symptoms starting just one hour ago I do not feel any lab work or imaging is indicated. Her neck pain is of unclear etiology, has normal neurologic exam and has acute on chronic neck pain. She's artery going to follow-up with her PCP in 2 days, will recommend she keep this appointment.    Audree CamelScott T Chaney Maclaren, MD 04/16/14 [id number]

## 2014-04-16 NOTE — ED Notes (Signed)
Discharge condition charted on incorrect patient

## 2014-04-16 NOTE — Discharge Instructions (Signed)
Upper Respiratory Infection, Adult °An upper respiratory infection (URI) is also sometimes known as the common cold. The upper respiratory tract includes the nose, sinuses, throat, trachea, and bronchi. Bronchi are the airways leading to the lungs. Most people improve within 1 week, but symptoms can last up to 2 weeks. A residual cough may last even longer.  °CAUSES °Many different viruses can infect the tissues lining the upper respiratory tract. The tissues become irritated and inflamed and often become very moist. Mucus production is also common. A cold is contagious. You can easily spread the virus to others by oral contact. This includes kissing, sharing a glass, coughing, or sneezing. Touching your mouth or nose and then touching a surface, which is then touched by another person, can also spread the virus. °SYMPTOMS  °Symptoms typically develop 1 to 3 days after you come in contact with a cold virus. Symptoms vary from person to person. They may include: °· Runny nose. °· Sneezing. °· Nasal congestion. °· Sinus irritation. °· Sore throat. °· Loss of voice (laryngitis). °· Cough. °· Fatigue. °· Muscle aches. °· Loss of appetite. °· Headache. °· Low-grade fever. °DIAGNOSIS  °You might diagnose your own cold based on familiar symptoms, since most people get a cold 2 to 3 times a year. Your caregiver can confirm this based on your exam. Most importantly, your caregiver can check that your symptoms are not due to another disease such as strep throat, sinusitis, pneumonia, asthma, or epiglottitis. Blood tests, throat tests, and X-rays are not necessary to diagnose a common cold, but they may sometimes be helpful in excluding other more serious diseases. Your caregiver will decide if any further tests are required. °RISKS AND COMPLICATIONS  °You may be at risk for a more severe case of the common cold if you smoke cigarettes, have chronic heart disease (such as heart failure) or lung disease (such as asthma), or if  you have a weakened immune system. The very young and very old are also at risk for more serious infections. Bacterial sinusitis, middle ear infections, and bacterial pneumonia can complicate the common cold. The common cold can worsen asthma and chronic obstructive pulmonary disease (COPD). Sometimes, these complications can require emergency medical care and may be life-threatening. °PREVENTION  °The best way to protect against getting a cold is to practice good hygiene. Avoid oral or hand contact with people with cold symptoms. Wash your hands often if contact occurs. There is no clear evidence that vitamin C, vitamin E, echinacea, or exercise reduces the chance of developing a cold. However, it is always recommended to get plenty of rest and practice good nutrition. °TREATMENT  °Treatment is directed at relieving symptoms. There is no cure. Antibiotics are not effective, because the infection is caused by a virus, not by bacteria. Treatment may include: °· Increased fluid intake. Sports drinks offer valuable electrolytes, sugars, and fluids. °· Breathing heated mist or steam (vaporizer or shower). °· Eating chicken soup or other clear broths, and maintaining good nutrition. °· Getting plenty of rest. °· Using gargles or lozenges for comfort. °· Controlling fevers with ibuprofen or acetaminophen as directed by your caregiver. °· Increasing usage of your inhaler if you have asthma. °Zinc gel and zinc lozenges, taken in the first 24 hours of the common cold, can shorten the duration and lessen the severity of symptoms. Pain medicines may help with fever, muscle aches, and throat pain. A variety of non-prescription medicines are available to treat congestion and runny nose. Your caregiver   can make recommendations and may suggest nasal or lung inhalers for other symptoms.  HOME CARE INSTRUCTIONS   Only take over-the-counter or prescription medicines for pain, discomfort, or fever as directed by your  caregiver.  Use a warm mist humidifier or inhale steam from a shower to increase air moisture. This may keep secretions moist and make it easier to breathe.  Drink enough water and fluids to keep your urine clear or pale yellow.  Rest as needed.  Return to work when your temperature has returned to normal or as your caregiver advises. You may need to stay home longer to avoid infecting others. You can also use a face mask and careful hand washing to prevent spread of the virus. SEEK MEDICAL CARE IF:   After the first few days, you feel you are getting worse rather than better.  You need your caregiver's advice about medicines to control symptoms.  You develop chills, worsening shortness of breath, or brown or red sputum. These may be signs of pneumonia.  You develop yellow or brown nasal discharge or pain in the face, especially when you bend forward. These may be signs of sinusitis.  You develop a fever, swollen neck glands, pain with swallowing, or white areas in the back of your throat. These may be signs of strep throat. SEEK IMMEDIATE MEDICAL CARE IF:   You have a fever.  You develop severe or persistent headache, ear pain, sinus pain, or chest pain.  You develop wheezing, a prolonged cough, cough up blood, or have a change in your usual mucus (if you have chronic lung disease).  You develop sore muscles or a stiff neck. Document Released: 10/23/2000 Document Revised: 07/22/2011 Document Reviewed: 08/04/2013 [facility] Patient Information 2015 [place], [place]. This information is not intended to replace advice given to you by your health care provider. Make sure you discuss any questions you have with your health care provider.    Nosebleed Nosebleeds can be caused by many conditions, including trauma, infections, polyps, foreign bodies, dry mucous membranes or climate, medicines, and air conditioning. Most nosebleeds occur in the front of the nose. Because of this location,  most nosebleeds can be controlled by pinching the nostrils gently and continuously for at least 10 to 20 minutes. The long, continuous pressure allows enough time for the blood to clot. If pressure is released during that 10 to 20 minute time period, the process may have to be started again. The nosebleed may stop by itself or quit with pressure, or it may need concentrated heating (cautery) or pressure from packing. HOME CARE INSTRUCTIONS   If your nose was packed, try to maintain the pack inside until your health care provider removes it. If a gauze pack was used and it starts to fall out, gently replace it or cut the end off. Do not cut if a balloon catheter was used to pack the nose. Otherwise, do not remove unless instructed.  Avoid blowing your nose for 12 hours after treatment. This could dislodge the pack or clot and start the bleeding again.  If the bleeding starts again, sit up and bend forward, gently pinching the front half of your nose continuously for 20 minutes.  If bleeding was caused by dry mucous membranes, use over-the-counter saline nasal spray or gel. This will keep the mucous membranes moist and allow them to heal. If you must use a lubricant, choose the water-soluble variety. Use it only sparingly and not within several hours of lying down.  Do not  use petroleum jelly or mineral oil, as these may drip into the lungs and cause serious problems.  Maintain humidity in your home by using less air conditioning or by using a humidifier.  Do not use aspirin or medicines which make bleeding more likely. Your health care provider can give you recommendations on this.  Resume normal activities as you are able, but try to avoid straining, lifting, or bending at the waist for several days.  If the nosebleeds become recurrent and the cause is unknown, your health care provider may suggest laboratory tests. SEEK MEDICAL CARE IF: You have a fever. SEEK IMMEDIATE MEDICAL CARE IF:    Bleeding recurs and cannot be controlled.  There is unusual bleeding from or bruising on other parts of the body.  Nosebleeds continue.  There is any worsening of the condition which originally brought you in.  You become light-headed, feel faint, become sweaty, or vomit blood. MAKE SURE YOU:   Understand these instructions.  Will watch your condition.  Will get help right away if you are not doing well or get worse. Document Released: 02/06/2005 Document Revised: 09/13/2013 Document Reviewed: 03/30/2009 [facility] Patient Information 2015 [place], [place]. This information is not intended to replace advice given to you by your health care provider. Make sure you discuss any questions you have with your health care provider.

## 2014-04-16 NOTE — ED Notes (Signed)
Patient states she woke up this morning and when she cleared her throat she was spitting up blood, sinus congestion for the past week, & states she felt blood drain from her nose into her throat, no fever, no cough

## 2014-09-14 ENCOUNTER — Other Ambulatory Visit: Payer: Self-pay | Admitting: Internal Medicine

## 2014-10-11 ENCOUNTER — Other Ambulatory Visit: Payer: Self-pay | Admitting: Gastroenterology

## 2014-10-11 DIAGNOSIS — R1084 Generalized abdominal pain: Secondary | ICD-10-CM

## 2014-10-11 DIAGNOSIS — R140 Abdominal distension (gaseous): Secondary | ICD-10-CM

## 2014-10-19 ENCOUNTER — Ambulatory Visit
Admission: RE | Admit: 2014-10-19 | Discharge: 2014-10-19 | Disposition: A | Discharge status: Home / Self Care | Payer: 59 | Source: Ambulatory Visit | Attending: Gastroenterology | Admitting: Gastroenterology

## 2014-10-19 DIAGNOSIS — R140 Abdominal distension (gaseous): Secondary | ICD-10-CM

## 2014-10-19 DIAGNOSIS — R1084 Generalized abdominal pain: Secondary | ICD-10-CM

## 2014-11-22 ENCOUNTER — Other Ambulatory Visit: Payer: Self-pay | Admitting: Family Medicine

## 2014-11-22 DIAGNOSIS — K469 Unspecified abdominal hernia without obstruction or gangrene: Secondary | ICD-10-CM

## 2014-11-22 DIAGNOSIS — K458 Other specified abdominal hernia without obstruction or gangrene: Secondary | ICD-10-CM

## 2014-11-25 ENCOUNTER — Ambulatory Visit
Admission: RE | Admit: 2014-11-25 | Discharge: 2014-11-25 | Disposition: A | Discharge status: Home / Self Care | Payer: 59 | Source: Ambulatory Visit | Attending: Family Medicine | Admitting: Family Medicine

## 2014-11-25 DIAGNOSIS — K458 Other specified abdominal hernia without obstruction or gangrene: Secondary | ICD-10-CM

## 2014-11-25 DIAGNOSIS — K469 Unspecified abdominal hernia without obstruction or gangrene: Secondary | ICD-10-CM

## 2015-03-14 ENCOUNTER — Other Ambulatory Visit: Payer: Self-pay | Admitting: Family Medicine

## 2015-03-14 DIAGNOSIS — N83209 Unspecified ovarian cyst, unspecified side: Secondary | ICD-10-CM

## 2015-03-16 ENCOUNTER — Encounter (INDEPENDENT_AMBULATORY_CARE_PROVIDER_SITE_OTHER): Payer: Self-pay

## 2015-03-16 ENCOUNTER — Ambulatory Visit
Admission: RE | Admit: 2015-03-16 | Discharge: 2015-03-16 | Disposition: A | Discharge status: Home / Self Care | Payer: 59 | Source: Ambulatory Visit | Attending: Family Medicine | Admitting: Family Medicine

## 2015-03-16 DIAGNOSIS — N83209 Unspecified ovarian cyst, unspecified side: Secondary | ICD-10-CM

## 2015-06-15 ENCOUNTER — Other Ambulatory Visit: Payer: Self-pay

## 2015-06-15 DIAGNOSIS — Z1231 Encounter for screening mammogram for malignant neoplasm of breast: Secondary | ICD-10-CM

## 2015-06-20 ENCOUNTER — Ambulatory Visit (INDEPENDENT_AMBULATORY_CARE_PROVIDER_SITE_OTHER): Payer: 59 | Admitting: Cardiovascular Disease

## 2015-06-20 ENCOUNTER — Encounter: Payer: Self-pay | Admitting: Cardiovascular Disease

## 2015-06-20 VITALS — BP 96/70 | HR 64 | Ht 66.0 in | Wt 203.2 lb

## 2015-06-20 DIAGNOSIS — I340 Nonrheumatic mitral (valve) insufficiency: Secondary | ICD-10-CM

## 2015-06-20 DIAGNOSIS — R002 Palpitations: Secondary | ICD-10-CM

## 2015-06-20 LAB — BASIC METABOLIC PANEL
BUN: 14 mg/dL (ref 7–25)
CHLORIDE: 104 mmol/L (ref 98–110)
CO2: 27 mmol/L (ref 20–31)
Calcium: 9.3 mg/dL (ref 8.6–10.2)
Creat: 0.78 mg/dL (ref 0.50–1.10)
GLUCOSE: 81 mg/dL (ref 65–99)
POTASSIUM: 4 mmol/L (ref 3.5–5.3)
Sodium: 138 mmol/L (ref 135–146)

## 2015-06-20 LAB — TSH: TSH: 1.26 mIU/L

## 2015-06-20 NOTE — Patient Instructions (Signed)
Medication Instructions:  Your physician recommends that you continue on your current medications as directed. Please refer to the Current Medication list given to you today.   Labwork: TODAY - basic metabolic panel, TSH   Testing/Procedures: Your physician has requested that you have an echocardiogram. Echocardiography is a painless test that uses sound waves to create images of your heart. It provides your doctor with information about the size and shape of your heart and how well your heart's chambers and valves are working. This procedure takes approximately one hour. There are no restrictions for this procedure.  Your physician has recommended that you wear an event monitor. Event monitors are medical devices that record the heart's electrical activity. Doctors most often [place] these monitors to diagnose arrhythmias. Arrhythmias are problems with the speed or rhythm of the heartbeat. The monitor is a small, portable device. You can wear one while you do your normal daily activities. This is usually used to diagnose what is causing palpitations/syncope (passing out).    Follow-Up: Your physician recommends that you schedule a follow-up appointment in: 3 months with Dr. Elease Hashimoto   If you need a refill on your cardiac medications before your next appointment, please call your pharmacy.   Thank you for choosing [facility]! Eligha Bridegroom, RN [phone]

## 2015-06-20 NOTE — Progress Notes (Signed)
Rebekah Bates Date of Birth  01-21-1966       [facility] Office [address]  [address] [place], [place]  [address]   [place], [place]  [address] [phone]     [phone]   Fax  [phone]     Fax [phone]  Problem List: 1. Mild mitral regurgitation 2. Palpitations  History of Present Illness:  Rebekah Bates was seen several years ago.    She has lots of noncardiac issues - many sound like anxiety She is exercising - 3-4 times a week.   Does well with exercise, no Cp or dyspnea.   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    She has palpitations at night.   She is not sleeping well.  Stays awake for lots of the night.    Feb. 7, 2017:  Back for a visit after 2 years.  Continues to have palpitations. Unable to sleep because of palpitations  Seem to have worsened over the past 6 months .   Has seen her medical doctor .   Has had TSH checked.  Was cleaning out the garage and had a sudden HR to the 140s.    This episode lasted 10-15 minutes.  Has stopped the Metoprolol several months  Cannot lie down to sleep.  Sleeps propped up in a recliner.    Current Outpatient Prescriptions on File Prior to Visit  Medication Sig Dispense Refill  . cyclobenzaprine (FLEXERIL) 10 MG tablet Take 10 mg by mouth 3 (three) times daily as needed for muscle spasms.    . diclofenac (VOLTAREN) 25 MG EC tablet Take 25 mg by mouth 2 (two) times daily.    Marland Kitchen doxycycline (DORYX) 100 MG EC tablet Take 100 mg by mouth as needed.     . clonazePAM (KLONOPIN) 0.5 MG tablet Take 0.5 mg by mouth 2 (two) times daily as needed for anxiety. Reported on 06/20/2015    . metoprolol succinate (TOPROL XL) 25 MG 24 hr tablet Take 1 tablet (25 mg total) by mouth daily. (Patient not taking: Reported on 06/20/2015) 30 tablet 11   No current facility-administered medications on file prior to visit.    No Known Allergies  Past Medical History  Diagnosis Date  .  Palpitations   . Anxiety   . Mild mitral regurgitation     No past surgical history on file.  History  Smoking status  . Never Smoker   Smokeless tobacco  . Not on file    History  Alcohol Use No    Family History  Problem Relation Age of Onset  . Hypertension Mother   . Diabetes Father   . Hypertension Father     Reviw of Systems:  Reviewed in the HPI.  All other systems are negative.  Physical Exam: Blood pressure 96/70, pulse 64, height  (1.676 m), weight 203 lb 3.2 oz (92.171 kg). Wt Readings from Last 3 Encounters:  06/20/15 203 lb 3.2 oz (92.171 kg)  08/30/13 175 lb 12.8 oz (79.742 kg)  06/01/13 175 lb (79.379 kg)     General: Well developed, well nourished, in no acute distress.  Head: Normocephalic, atraumatic, sclera non-icteric, mucus membranes are moist,   Neck: Supple. Carotids are 2 + without bruits. No JVD   Lungs: Clear   Heart: RR, no significant murmur  Abdomen: Soft, non-tender, non-distended with normal bowel sounds  Msk:  Strength and tone are normal  Extremities: No clubbing or cyanosis. No edema.  Distal pedal pulses are 2+ and equal    Neuro: CN II - XII intact.  Alert and oriented X 3.   Psych:  Normal   ECG: 08/30/2013: Normal sinus rhythm at 72. She has no ST or T wave changes.  Assessment / Plan:   1. Palpitations: Symptoms are perplexing.  Some of these sound like PVCs ,  Others sound like they could be SVT Will get an echo.   Will have her wear a 30 day monitor .   Will check TSH.  She is not sleeping well.   Has significant palpitations that keep her awake I'll see her in 3 months      Caeli Linehan, Deloris Ping, MD  06/20/2015 6:10 PM    [facility] [address] [place],  [address] [place], [place]  [address] Pager [phone] Phone: [phone]; Fax: [phone]   [facility]  [address] [place], [place]  [address] [phone]   Fax [phone]

## 2015-06-21 ENCOUNTER — Other Ambulatory Visit: Payer: Self-pay | Admitting: Cardiovascular Disease

## 2015-06-21 ENCOUNTER — Telehealth: Payer: Self-pay | Admitting: Cardiovascular Disease

## 2015-06-21 NOTE — Telephone Encounter (Signed)
New Message  Pt c/o medication issue: 1. Name of Medication: metroprolol  4. What is your medication issue? Request a call back to determine if she should move forward with taking it. She will need a new script and will it interfere with her other medications.

## 2015-06-26 ENCOUNTER — Ambulatory Visit
Admission: RE | Admit: 2015-06-26 | Discharge: 2015-06-26 | Disposition: A | Discharge status: Home / Self Care | Payer: 59 | Source: Ambulatory Visit

## 2015-06-26 ENCOUNTER — Ambulatory Visit (INDEPENDENT_AMBULATORY_CARE_PROVIDER_SITE_OTHER): Payer: 59

## 2015-06-26 DIAGNOSIS — R002 Palpitations: Secondary | ICD-10-CM

## 2015-06-26 DIAGNOSIS — I340 Nonrheumatic mitral (valve) insufficiency: Secondary | ICD-10-CM

## 2015-06-26 DIAGNOSIS — Z1231 Encounter for screening mammogram for malignant neoplasm of breast: Secondary | ICD-10-CM

## 2015-06-30 ENCOUNTER — Other Ambulatory Visit: Payer: Self-pay

## 2015-06-30 ENCOUNTER — Ambulatory Visit (HOSPITAL_COMMUNITY): Payer: 59 | Attending: Cardiovascular Disease

## 2015-06-30 DIAGNOSIS — I059 Rheumatic mitral valve disease, unspecified: Secondary | ICD-10-CM | POA: Diagnosis present

## 2015-06-30 DIAGNOSIS — R002 Palpitations: Secondary | ICD-10-CM | POA: Insufficient documentation

## 2015-06-30 DIAGNOSIS — I071 Rheumatic tricuspid insufficiency: Secondary | ICD-10-CM | POA: Insufficient documentation

## 2015-06-30 DIAGNOSIS — I340 Nonrheumatic mitral (valve) insufficiency: Secondary | ICD-10-CM | POA: Diagnosis not present

## 2015-07-04 NOTE — Telephone Encounter (Signed)
Spoke with pt and reviewed echo results with her. She is asking if she should continue metoprolol and continue to wear monitor.  I told her she should continue this medication and continue to wear monitor for ordered length of time.  Prescription was sent to pharmacy on 06/21/15

## 2015-07-04 NOTE — Telephone Encounter (Signed)
Follow Up   Pt called for ECHO results

## 2015-07-07 ENCOUNTER — Telehealth: Payer: Self-pay | Admitting: Cardiovascular Disease

## 2015-07-07 NOTE — Telephone Encounter (Signed)
New message      Pt c/o medication issue:  1. Name of Medication: metoprolol 2. How are you currently taking this medication (dosage and times per day)?   daily  3. Are you having a reaction (difficulty breathing--STAT)? no 4. What is your medication issue?  2 hrs after taking pill last night, pt began wheezing.  She is also very tired.  Should she cut the dosage in half?

## 2015-07-07 NOTE — Telephone Encounter (Signed)
Spoke with patient who states metoprolol is helping with palpitations but believes it is making her very tired and last night she started wheezing 2 hours after taking the medication.  I advised her that she can try the reduced dose of 12.5 mg to see if her symptoms improve.  I advised that if that dose does not help with palpitations then we can likely try a different medication or try having her take it twice daily.  I advised her to call back with questions or concerns.  She verbalized understanding and agreement.

## 2015-07-10 ENCOUNTER — Other Ambulatory Visit (HOSPITAL_COMMUNITY)
Admission: RE | Admit: 2015-07-10 | Discharge: 2015-07-10 | Disposition: A | Discharge status: Home / Self Care | Payer: 59 | Source: Ambulatory Visit | Attending: Obstetrics and Gynecology | Admitting: Obstetrics and Gynecology

## 2015-07-10 ENCOUNTER — Other Ambulatory Visit: Payer: Self-pay | Admitting: Obstetrics and Gynecology

## 2015-07-10 DIAGNOSIS — Z01419 Encounter for gynecological examination (general) (routine) without abnormal findings: Secondary | ICD-10-CM | POA: Diagnosis present

## 2015-07-10 DIAGNOSIS — Z1151 Encounter for screening for human papillomavirus (HPV): Secondary | ICD-10-CM | POA: Diagnosis present

## 2015-07-11 LAB — CYTOLOGY - PAP

## 2015-08-02 ENCOUNTER — Telehealth: Payer: Self-pay | Admitting: Cardiovascular Disease

## 2015-08-02 NOTE — Telephone Encounter (Signed)
New message ° ° ° ° ° °Returning a call to the nurse to get monitor results °

## 2015-08-02 NOTE — Telephone Encounter (Signed)
Reviewed results of cardiac monitor with patient which showed NSR, no significant arrhythmias per Dr. Elease HashimotoNahser.  Recent echo was also normal and patient states palpitations are not as bad as they have been in the past.  She prefers to cancel 3 month follow-up appointment and will call back with worsening symptoms.  I advised her that I will place a recall for a 1 year follow-up.  She thanked me for the call.

## 2015-09-25 ENCOUNTER — Ambulatory Visit: Payer: 59 | Admitting: Cardiovascular Disease

## 2015-10-16 ENCOUNTER — Other Ambulatory Visit: Payer: Self-pay | Admitting: Cardiovascular Disease

## 2015-10-16 MED ORDER — METOPROLOL SUCCINATE ER 25 MG PO TB24: 25.0000 mg | ORAL_TABLET | Freq: Every day | ORAL | Status: DC

## 2016-01-30 ENCOUNTER — Other Ambulatory Visit: Payer: Self-pay | Admitting: Nurse Practitioner

## 2016-01-30 ENCOUNTER — Other Ambulatory Visit: Payer: Self-pay | Admitting: Cardiovascular Disease

## 2016-01-30 MED ORDER — PROPRANOLOL HCL 10 MG PO TABS: 10.0000 mg | ORAL_TABLET | Freq: Four times a day (QID) | ORAL | 11 refills | Status: DC | PRN

## 2016-01-30 NOTE — Telephone Encounter (Signed)
Spoke with patient who states she is having bilateral pain in achilles tendons, weight gain and swelling all over.  She states her left foot is swollen also.  She states she believes metoprolol causing weight gain, fluid retention, and swelling.  States she has never had trouble losing weight by dieting until the last few months.  States she started metoprolol in June and it is the only one she takes on a daily basis in addition to birth control pills. I asked if she associates the weight gain and fluid retention with the birth control pills and she states she was having these issues prior to starting the birth control pills.  She states she continues to have palpitations. I advised that we could try a shorter acting beta blocker that she could take as needed, such as propranolol.  She states she would like to make this change.  I advised I will forward to Dr. Elease HashimotoNahser for his review and will call her back with his advice.  She verbalized understanding and agreement.

## 2016-01-30 NOTE — Telephone Encounter (Signed)
New message   Pt c/o medication issue:  1. Name of Medication: metoprolol   2. How are you currently taking this medication (dosage and times per day)? 25 mg once a day   3. Are you having a reaction (difficulty breathing--STAT)? Retaining fluids, left foot swelling.    4. What is your medication issue? Need to discuss with nurse.

## 2016-05-02 ENCOUNTER — Telehealth: Payer: Self-pay | Admitting: Cardiovascular Disease

## 2016-05-02 NOTE — Telephone Encounter (Signed)
Spoke with patient who states she is having worsening SOB and swelling in arms and legs. She asks if Dr. Elease HashimotoNahser would prescribe a diuretic.  I discussed diet with her and she admits to eating out regularly.  She state she eats soup every day at lunch.  I reviewed low salt diet information with her and advised her to limit salt intake. She is due for 1 year follow-up in March and would like to come in sooner to discuss her concerns with Dr. Elease HashimotoNahser.  I scheduled her for 1/9.  She verbalized understanding and agreement and thanked me for the call.

## 2016-05-02 NOTE — Telephone Encounter (Signed)
New Message  Pt call requesting to speak with RN about getting a water re tension medication prescribed to her. Pt states her weight has been increasing and decreasing. Please call back to discuss

## 2016-05-21 ENCOUNTER — Ambulatory Visit: Payer: 59 | Admitting: Cardiovascular Disease

## 2016-06-30 ENCOUNTER — Other Ambulatory Visit: Payer: Self-pay | Admitting: Cardiovascular Disease

## 2016-07-02 NOTE — Telephone Encounter (Signed)
Patient advised on 9/19 that she thought the metoprolol was causing bad side effects and did not want to take it any longer. I think it is best to d/c it at this time.

## 2016-07-02 NOTE — Telephone Encounter (Signed)
Please advise on refill request as this medication is not listed on patients current med list as it was removed 01/30/16 with a reason of patient preference but she last refilled this on 04/11/16 which is on rx request as below. Thanks, MI  Will file in chart as: metoprolol succinate (TOPROL-XL) 25 MG 24 hr tablet The source prescription was discontinued on 01/30/2016 by Levi AlandMichelle M Swinyer, RN for the following reason: Patient Preference. TAKE 1 TABLET BY MOUTH DAILY Disp: 90 tablet Refills: Not specified   Class: Normal Start: 06/30/2016  Originally ordered: 2 years ago by Vesta MixerPhilip J [name], MD Last refill: 04/11/2016

## 2016-07-10 IMAGING — US US TRANSVAGINAL NON-OB
1 series · 13 of 25 positions shown · non-contrast
Comparison: 10/19/2014

CLINICAL DATA: Followup complex left ovarian cyst.

EXAM:
TRANSABDOMINAL AND TRANSVAGINAL ULTRASOUND OF PELVIS
TECHNIQUE: Both transabdominal and transvaginal ultrasound examinations of the
pelvis were performed. Transabdominal technique was performed for
global imaging of the pelvis including uterus, ovaries, adnexal
regions, and pelvic cul-de-sac. It was necessary to proceed with
endovaginal exam following the transabdominal exam to visualize the
endometrium and ovaries.

[Series 1: us transvaginal non-ob · 0.30mm/px · 13 of 94 slices shown]
[im 1/94]
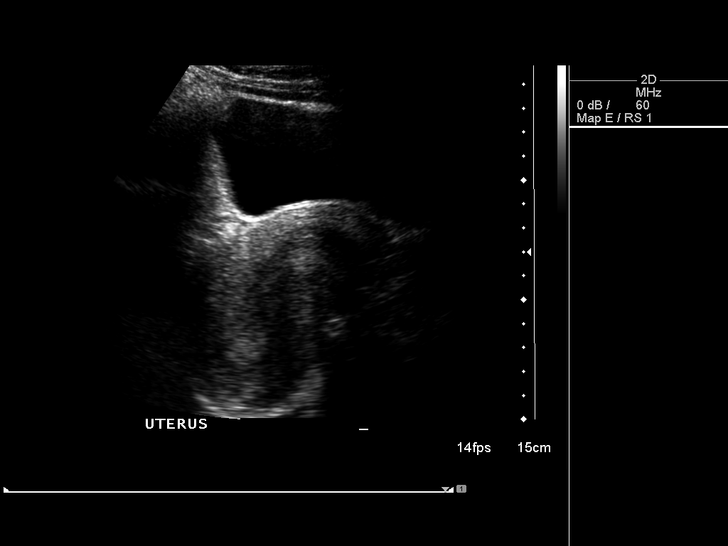
[im 8/94]
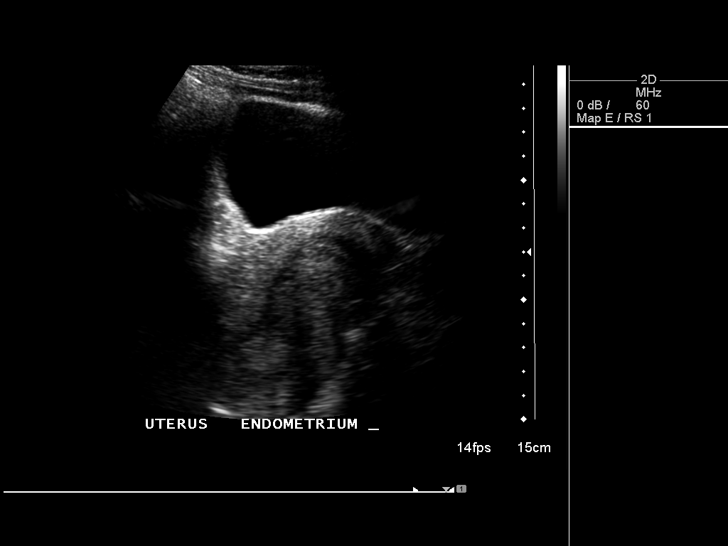
[im 16/94]
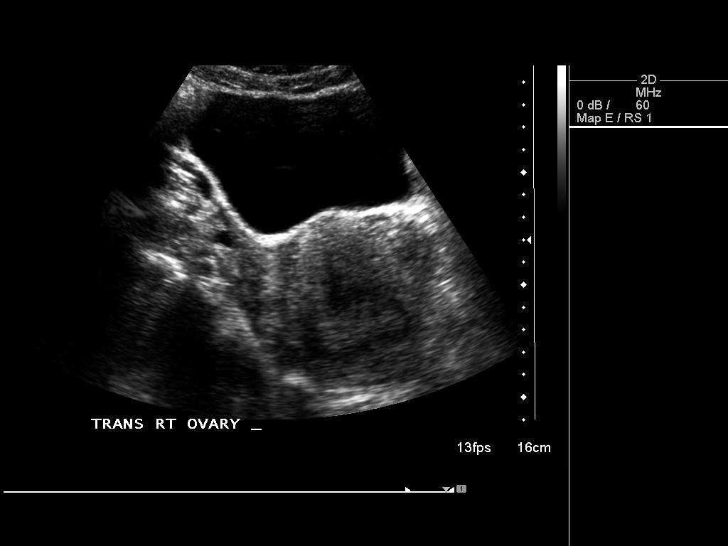
[im 24/94]
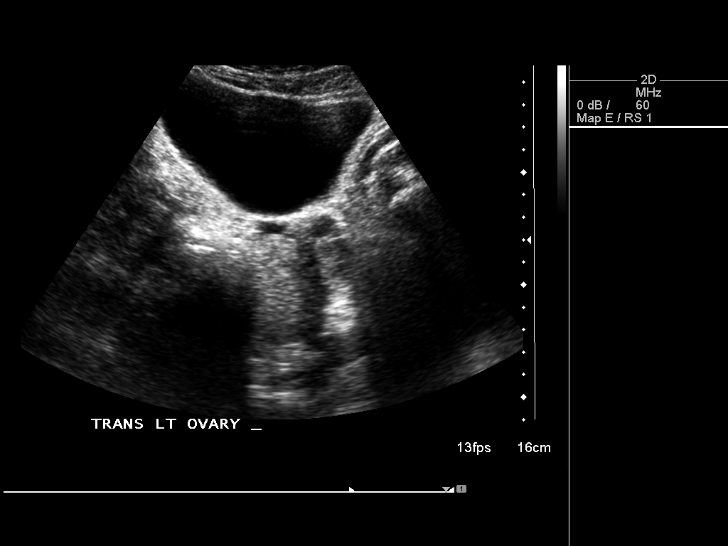
[im 32/94]
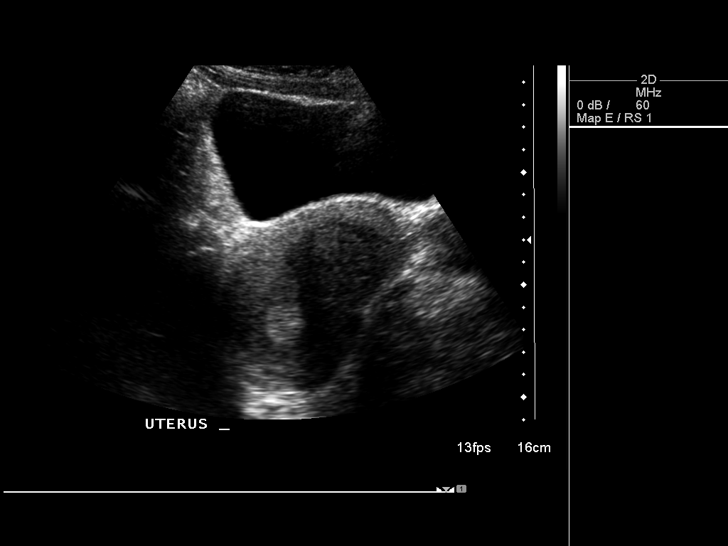
[im 39/94]
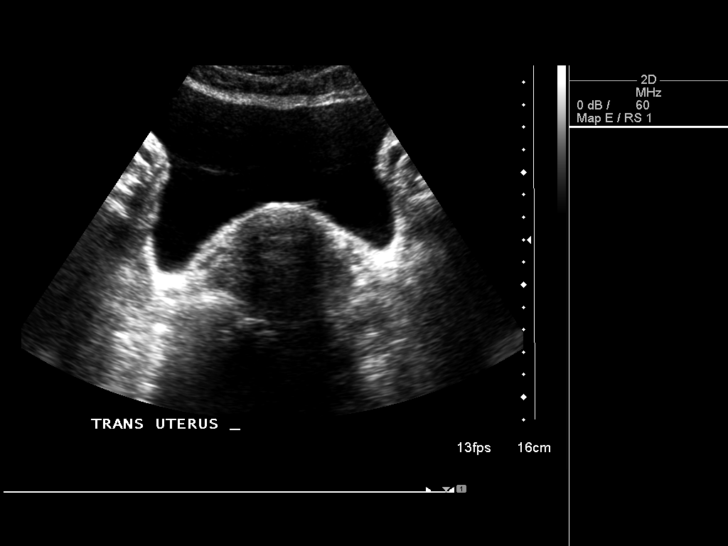
[im 47/94]
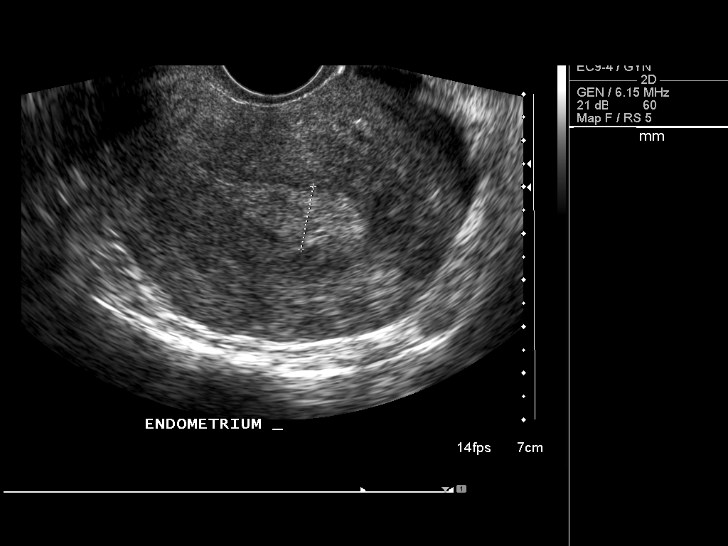
[im 55/94]
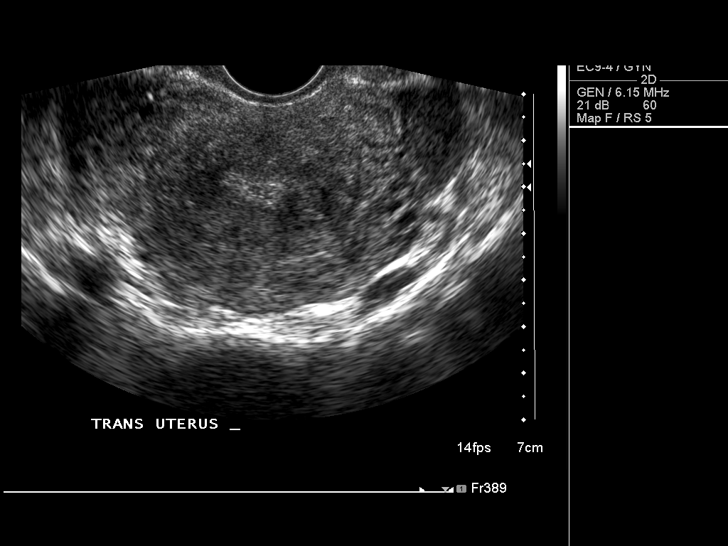
[im 63/94]
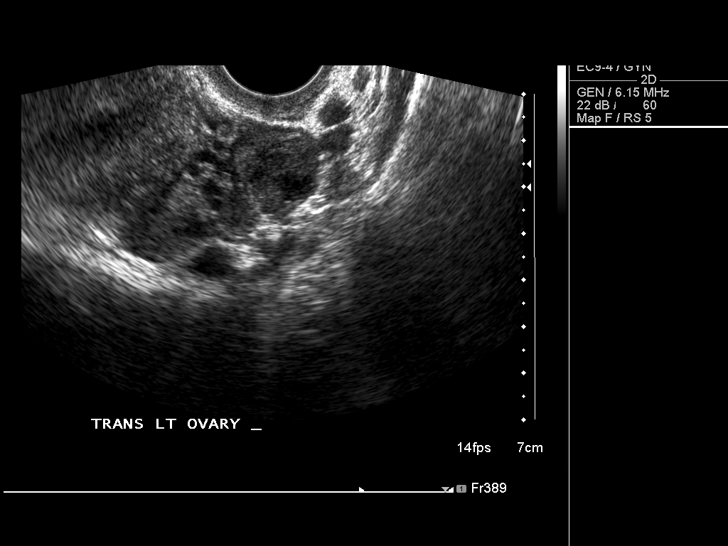
[im 70/94]
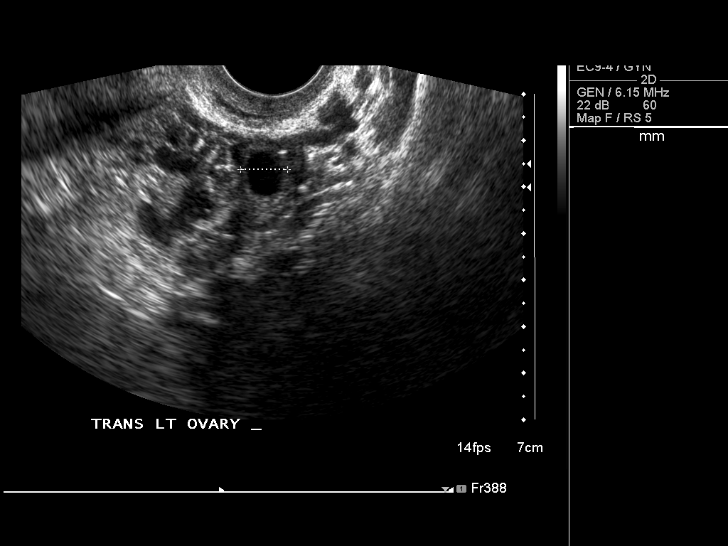
[im 78/94]
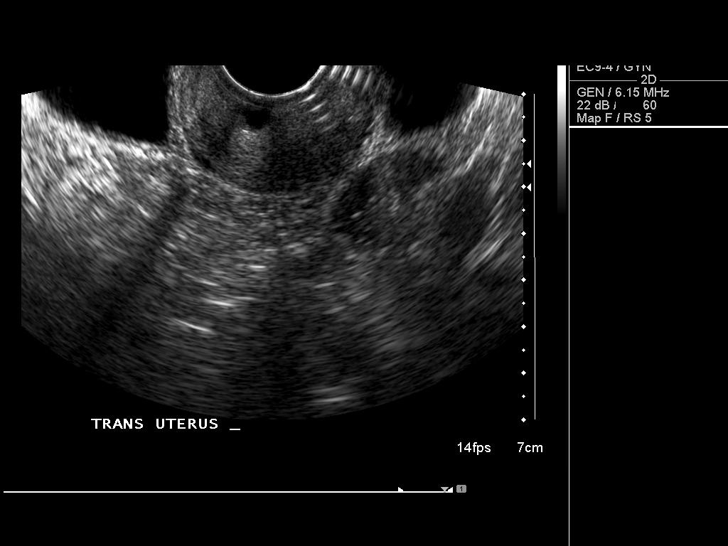
[im 86/94]
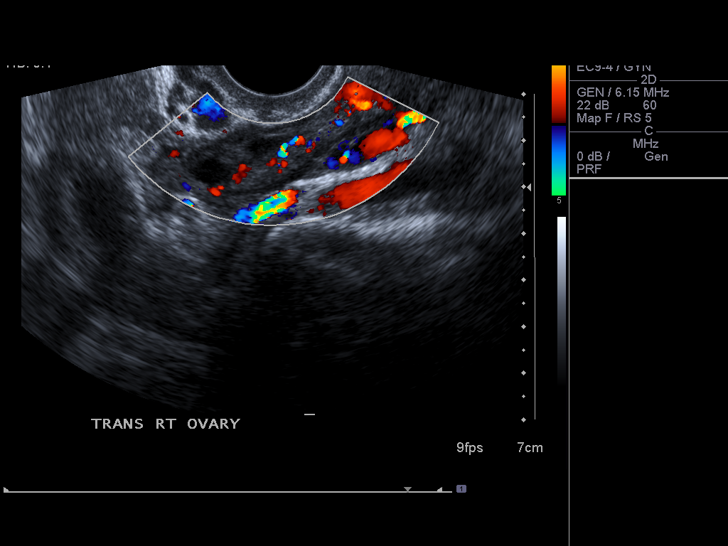
[im 94/94]
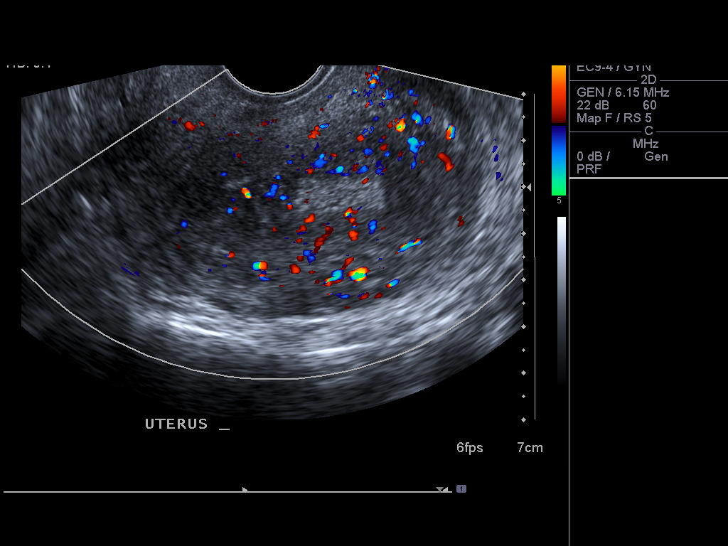

[13 of 25 positions shown; findings below may reference images not displayed]

FINDINGS: Uterus

Measurements: 10.3 x 5.7 x 7.8 cm. Retroverted. A submucosal fibroid
is seen in the left uterine corpus which indents the endometrial
stripe. This measures 1.7 cm in maximum diameter. No other fibroids
identified.

Endometrium

Thickness: 14 mm. Mass effect from small submucosal fibroid
described above.

Right ovary

Measurements: 3.9 x 1.9 x 2.6 cm. Normal appearance/no adnexal mass.

Left ovary

Measurements: 4.2 x 2.2 x 2.3 cm. Small left ovarian corpus luteum
noted. Previously seen complex left ovarian cystic lesion has
resolved since prior exam.

Other findings

No free fluid.
IMPRESSION: Normal appearance of both ovaries. Resolution of complex left
ovarian cyst since prior study.

Retroverted uterus with 1.7 cm submucosal fibroid.

## 2016-09-19 ENCOUNTER — Other Ambulatory Visit: Payer: Self-pay | Admitting: Family Medicine

## 2016-09-19 DIAGNOSIS — Z1231 Encounter for screening mammogram for malignant neoplasm of breast: Secondary | ICD-10-CM

## 2016-10-31 ENCOUNTER — Ambulatory Visit
Admission: RE | Admit: 2016-10-31 | Discharge: 2016-10-31 | Disposition: A | Discharge status: Home / Self Care | Payer: BC Managed Care – PPO | Source: Ambulatory Visit | Attending: Family Medicine | Admitting: Family Medicine

## 2016-10-31 DIAGNOSIS — Z1231 Encounter for screening mammogram for malignant neoplasm of breast: Secondary | ICD-10-CM

## 2017-03-07 ENCOUNTER — Other Ambulatory Visit: Payer: Self-pay | Admitting: Family Medicine

## 2017-03-07 DIAGNOSIS — R5381 Other malaise: Secondary | ICD-10-CM

## 2017-03-07 DIAGNOSIS — M5432 Sciatica, left side: Secondary | ICD-10-CM

## 2017-03-19 ENCOUNTER — Other Ambulatory Visit: Payer: Self-pay | Admitting: Family Medicine

## 2017-03-19 ENCOUNTER — Ambulatory Visit
Admission: RE | Admit: 2017-03-19 | Discharge: 2017-03-19 | Disposition: A | Discharge status: Home / Self Care | Payer: BC Managed Care – PPO | Source: Ambulatory Visit | Attending: Family Medicine | Admitting: Family Medicine

## 2017-03-19 DIAGNOSIS — M5432 Sciatica, left side: Secondary | ICD-10-CM

## 2017-06-26 ENCOUNTER — Encounter: Payer: Self-pay | Admitting: Surgery

## 2017-06-26 DIAGNOSIS — K436 Other and unspecified ventral hernia with obstruction, without gangrene: Secondary | ICD-10-CM | POA: Insufficient documentation

## 2017-07-16 ENCOUNTER — Encounter: Payer: Self-pay | Admitting: Cardiovascular Disease

## 2017-07-24 ENCOUNTER — Ambulatory Visit: Payer: BC Managed Care – PPO | Admitting: Cardiovascular Disease

## 2017-09-08 ENCOUNTER — Encounter: Payer: Self-pay | Admitting: Cardiovascular Disease

## 2017-09-08 ENCOUNTER — Ambulatory Visit: Payer: BC Managed Care – PPO | Admitting: Cardiovascular Disease

## 2017-09-08 VITALS — BP 130/80 | HR 77 | Ht 66.0 in | Wt 210.0 lb

## 2017-09-08 DIAGNOSIS — R0602 Shortness of breath: Secondary | ICD-10-CM

## 2017-09-08 DIAGNOSIS — R400 Somnolence: Secondary | ICD-10-CM | POA: Diagnosis not present

## 2017-09-08 DIAGNOSIS — R5383 Other fatigue: Secondary | ICD-10-CM

## 2017-09-08 NOTE — Progress Notes (Signed)
Rebekah Bates Date of Birth  Mar 27, 1966       [facility] [address]  [address], [address] [place], [place]  [address]   [place], [place]  [address] [phone]     [phone]   Fax  [phone]     Fax [phone]  Problem List: 1. Mild mitral regurgitation 2. Palpitations   Rebekah Bates was seen several years ago.    She has lots of noncardiac issues - many sound like anxiety She is exercising - 3-4 times a week.   Does well with exercise, no Cp or dyspnea.   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    She has palpitations at night.   She is not sleeping well.  Stays awake for lots of the night.    Feb. 7, 2017:  Back for a visit after 2 years.  Continues to have palpitations. Unable to sleep because of palpitations  Seem to have worsened over the past 6 months .   Has seen her medical doctor .   Has had TSH checked.  Was cleaning out the garage and had a sudden HR to the 140s.    This episode lasted 10-15 minutes.  Has stopped the Metoprolol several months  Cannot lie down to sleep.  Sleeps propped up in a recliner.   September 08, 2017:   Rebekah Bates is seen today following a 2-year absence. Is not sleeping well.   Gets short of breath if she lies down flat.  Echo in 2017 shows normal LV function.  Takes lasix on occasion when her ankles swell.  Goes to the gym and exercises regularly .   Is not sleeping well at night .   Advised to get some info about sleep hygeine .     Current Outpatient Medications on File Prior to Visit  Medication Sig Dispense Refill  . clonazePAM (KLONOPIN) 0.5 MG tablet Take 0.5 mg by mouth 2 (two) times daily as needed for anxiety. Reported on 06/20/2015    . cyclobenzaprine (FLEXERIL) 10 MG tablet Take 10 mg by mouth 3 (three) times daily as needed for muscle spasms.    . diazepam (VALIUM) 5 MG tablet Take 5 mg by mouth every 6 (six) hours as needed for anxiety.    . diclofenac (VOLTAREN) 25 MG EC  tablet Take 25 mg by mouth 2 (two) times daily.    Marland Kitchen doxycycline (DORYX) 100 MG EC tablet Take 100 mg by mouth as needed.     Marland Kitchen oxyCODONE-acetaminophen (PERCOCET) 7.5-325 MG tablet Take 1 tablet by mouth every 4 (four) hours as needed for moderate pain or severe pain.    Marland Kitchen propranolol (INDERAL) 10 MG tablet Take 1 tablet (10 mg total) by mouth 4 (four) times daily as needed. 60 tablet 11   No current facility-administered medications on file prior to visit.     No Known Allergies  Past Medical History:  Diagnosis Date  . Anxiety   . Mild mitral regurgitation   . Palpitations     History reviewed. No pertinent surgical history.  Social History   Tobacco Use  Smoking Status Never Smoker  Smokeless Tobacco Never Used    Social History   Substance and Sexual Activity  Alcohol Use No    Family History  Problem Relation Age of Onset  . Hypertension Mother   . Diabetes Father   . Hypertension Father     Reviw of Systems:  Recorded in current history.  Otherwise negative review of systems.  Physical Exam: Blood pressure 130/80, pulse 77, height  (1.676 m), weight 210 lb (95.3 kg), SpO2 99 %.  GEN:  Well nourished, well developed in no acute distress HEENT: Normal NECK: No JVD; No carotid bruits LYMPHATICS: No lymphadenopathy CARDIAC: RR, no murmurs, rubs, gallops RESPIRATORY:  Clear to auscultation without rales, wheezing or rhonchi  ABDOMEN: Soft, non-tender, non-distended MUSCULOSKELETAL:  No edema; No deformity  SKIN: Warm and dry NEUROLOGIC:  Alert and oriented x 3    ECG: September 08, 2017: Normal sinus rhythm at 77.  EKG is normal.  Assessment / Plan:   1. Palpitations: Check a CBC, basic metabolic profile, TSH. His propranolol as needed.  I suspect that a real issue is obstructive sleep apnea.  She has lots of daytime somnolence.  She can fall asleep sitting up in about 30 seconds.  She wakes up just as tired she was when she went to bed.  2.  Up  probable obstructive sleep apnea: We will arrange for a sleep study.       Kristeen Miss, MD  09/08/2017 2:34 PM    [facility] [address] [place],  [address] [place], [place]  [address] Pager [phone] Phone: [phone]; Fax: [phone]

## 2017-09-08 NOTE — Patient Instructions (Addendum)
Medication Instructions:  Your physician recommends that you continue on your current medications as directed. Please refer to the Current Medication list given to you today.   Labwork: TODAY - CBC, TSH, BMET   Testing/Procedures: Your physician has recommended that you have a sleep study. This test records several body functions during sleep, including: brain activity, eye movement, oxygen and carbon dioxide blood levels, heart rate and rhythm, breathing rate and rhythm, the flow of air through your mouth and nose, snoring, body muscle movements, and chest and belly movement. You will receive a call from our office to get this scheduled   Follow-Up: Your physician recommends that you schedule a follow-up appointment in: 3 months with Dr. Elease Hashimoto   If you need a refill on your cardiac medications before your next appointment, please call your pharmacy.   Thank you for choosing [facility]! Eligha Bridegroom, RN [phone]

## 2017-09-09 ENCOUNTER — Telehealth: Payer: Self-pay | Admitting: *Deleted

## 2017-09-09 LAB — BASIC METABOLIC PANEL
BUN / CREAT RATIO: 7 — AB (ref 9–23)
BUN: 7 mg/dL (ref 6–24)
CHLORIDE: 104 mmol/L (ref 96–106)
CO2: 21 mmol/L (ref 20–29)
Calcium: 9.6 mg/dL (ref 8.7–10.2)
Creatinine, Ser: 0.99 mg/dL (ref 0.57–1.00)
GFR calc Af Amer: 77 mL/min/{1.73_m2} (ref >59)
GFR calc non Af Amer: 67 mL/min/{1.73_m2} (ref >59)
GLUCOSE: 80 mg/dL (ref 65–99)
POTASSIUM: 3.8 mmol/L (ref 3.5–5.2)
SODIUM: 144 mmol/L (ref 134–144)

## 2017-09-09 LAB — CBC
Hematocrit: 39.5 % (ref 34.0–46.6)
Hemoglobin: 12.8 g/dL (ref 11.1–15.9)
MCH: 26.9 pg (ref 26.6–33.0)
MCHC: 32.4 g/dL (ref 31.5–35.7)
MCV: 83 fL (ref 79–97)
PLATELETS: 354 10*3/uL (ref 150–379)
RBC: 4.76 x10E6/uL (ref 3.77–5.28)
RDW: 14.3 % (ref 12.3–15.4)
WBC: 6.1 10*3/uL (ref 3.4–10.8)

## 2017-09-09 LAB — TSH: TSH: 0.901 u[IU]/mL (ref 0.450–4.500)

## 2017-09-09 NOTE — Telephone Encounter (Signed)
Left message to return a call to me. ( need to give sleep study appointment.)

## 2017-09-09 NOTE — Telephone Encounter (Signed)
Patient returned a call to me. She was informed of her 10/01/17 sleep study appointment. She also asked if her insurance company approved the test. I informed her per Clifton Custard F. @ [employer] no PA required. She asked about how much money this will cost her. I told her she will have to contact her insurance company for that information. They only tell me whether she needs "permission" to do the test. The CPT code [address] provided to the patient to give the insurance company when she contacts them.

## 2017-09-09 NOTE — Telephone Encounter (Signed)
-----   Message from Levi Aland, RN sent at 09/08/2017  3:05 PM EDT ----- We have ordered sleep study for Dr. Harvie Bridge patient; Epworth score is in chart  Thank you, Marcelino Duster

## 2017-10-01 ENCOUNTER — Encounter (HOSPITAL_BASED_OUTPATIENT_CLINIC_OR_DEPARTMENT_OTHER): Payer: BC Managed Care – PPO

## 2017-12-11 ENCOUNTER — Encounter: Payer: Self-pay | Admitting: Cardiovascular Disease

## 2017-12-11 ENCOUNTER — Ambulatory Visit: Payer: BC Managed Care – PPO | Admitting: Cardiovascular Disease

## 2017-12-11 VITALS — BP 120/78 | HR 76 | Ht 66.0 in | Wt 213.0 lb

## 2017-12-11 DIAGNOSIS — R002 Palpitations: Secondary | ICD-10-CM | POA: Diagnosis not present

## 2017-12-11 NOTE — Progress Notes (Signed)
Rebekah Bates Date of Birth  08-14-1965       [facility]    [facility] [address], [address]  [address] [place], [place]  [id number]   [place]  [id number] [phone]     [phone]   Fax  [phone]     Fax [phone]  Problem List: 1. Mild mitral regurgitation 2. Palpitations   Rebekah BattiestCharo was seen several years ago.    She has lots of noncardiac issues - many sound like anxiety She is exercising - 3-4 times a week.   Does well with exercise, no Cp or dyspnea.   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    She has palpitations at night.   She is not sleeping well.  Stays awake for lots of the night.    Feb. 7, 2017:  Back for a visit after 2 years.  Continues to have palpitations. Unable to sleep because of palpitations  Seem to have worsened over the past 6 months .   Has seen her medical doctor .   Has had TSH checked.  Was cleaning out the garage and had a sudden HR to the 140s.    This episode lasted 10-15 minutes.  Has stopped the Metoprolol several months  Cannot lie down to sleep.  Sleeps propped up in a recliner.   September 08, 2017:   Rebekah BattiestCharo is seen today following a 2-year absence. Is not sleeping well.   Gets short of breath if she lies down flat.  Echo in 2017 shows normal LV function.  Takes lasix on occasion when her ankles swell.  Goes to the gym and exercises regularly .   Is not sleeping well at night .   Advised to get some info about sleep hygeine .   Aug. 1, 2019: Still having palps. Usually at bedtime .  Does not prevent her from doing her regular activities.    Current Outpatient Medications on File Prior to Visit  Medication Sig Dispense Refill  . clindamycin (CLINDAGEL) 1 % gel Apply 1 application topically daily.  11  . clonazePAM (KLONOPIN) 0.5 MG tablet Take 0.5 mg by mouth 2 (two) times daily as needed for anxiety. Reported on 06/20/2015    . cyclobenzaprine (FLEXERIL) 10 MG tablet Take 10 mg by  mouth 3 (three) times daily as needed for muscle spasms.    . diclofenac (VOLTAREN) 25 MG EC tablet Take 25 mg by mouth 2 (two) times daily.    Marland Kitchen. doxycycline (DORYX) 100 MG EC tablet Take 100 mg by mouth as needed.     . furosemide (LASIX) 40 MG tablet TAKE 1 TABLET BY MOUTH TWICE A WEEK AS NEEDED FOR FLUID  11  . KURVELO 0.15-30 MG-MCG tablet Take 1 tablet by mouth daily.  11  . meloxicam (MOBIC) 15 MG tablet Take 15 mg by mouth daily. with food  3  . oxyCODONE-acetaminophen (PERCOCET) 7.5-325 MG tablet Take 1 tablet by mouth every 4 (four) hours as needed for moderate pain or severe pain.    Marland Kitchen. propranolol (INDERAL) 10 MG tablet Take 1 tablet (10 mg total) by mouth 4 (four) times daily as needed. 60 tablet 11  . valACYclovir (VALTREX) 500 MG tablet TAKE 1 TABLET BY MOUTH EVERY 12 HOURS FOR 3 DAYS AS NEEDED  2   No current facility-administered medications on file prior to visit.     No Known Allergies  Past Medical History:  Diagnosis Date  .  Anxiety   . Mild mitral regurgitation   . Palpitations     History reviewed. No pertinent surgical history.  Social History   Tobacco Use  Smoking Status Never Smoker  Smokeless Tobacco Never Used    Social History   Substance and Sexual Activity  Alcohol Use No    Family History  Problem Relation Age of Onset  . Hypertension Mother   . Diabetes Father   . Hypertension Father     Reviw of Systems:  Recorded in current history.  Otherwise negative review of systems.  Physical Exam: Blood pressure 120/78, pulse 76, height 5\' 6"  (1.676 m), weight 213 lb (96.6 kg), SpO2 97 %.  GEN:  Well nourished, well developed in no acute distress HEENT: Normal NECK: No JVD; No carotid bruits LYMPHATICS: No lymphadenopathy CARDIAC: RRR, no murmurs, rubs, gallops RESPIRATORY:  Clear to auscultation without rales, wheezing or rhonchi  ABDOMEN: Soft, non-tender, non-distended MUSCULOSKELETAL:  No edema; No deformity  SKIN: Warm and  dry NEUROLOGIC:  Alert and oriented x 3    ECG:    Assessment / Plan:   1. Palpitations: Rebekah Bates continues to have episodes of palpitations.  She is worn a monitor in the past.  She is willing to wear a monitor again.  I suspect that these are premature ventricular contractions.  I reassured her that these are likely benign.  They seem to cause her lots of anxiety.  2.  Possible sleep apnea: Patient had a sleep study and was told that she does not have obstructive sleep apnea.  She is fatigued for other reasons.  She may not sleep well for other various reasons.       Kristeen Miss, MD  12/11/2017 10:36 AM    [facility] [address] [place],  [address] [place], [place]  [address] Pager [phone] Phone: [phone]; Fax: [phone]

## 2017-12-11 NOTE — Patient Instructions (Signed)
Medication Instructions:  Your physician recommends that you continue on your current medications as directed. Please refer to the Current Medication list given to you today.   Labwork: None Ordered   Testing/Procedures: Your physician has recommended that you wear an event monitor. Event monitors are medical devices that record the heart's electrical activity. Doctors most often us these monitors to diagnose arrhythmias. Arrhythmias are problems with the speed or rhythm of the heartbeat. The monitor is a small, portable device. You can wear one while you do your normal daily activities. This is usually used to diagnose what is causing palpitations/syncope (passing out).   Follow-Up: Your physician wants you to follow-up in: 6 months with Dr. [name].  You will receive a reminder letter in the mail two months in advance. If you don't receive a letter, please call our office to schedule the follow-up appointment.   If you need a refill on your cardiac medications before your next appointment, please call your pharmacy.   Thank you for choosing [facility]! Kyan Yurkovich, RN [phone]    

## 2017-12-12 ENCOUNTER — Encounter: Payer: Self-pay | Admitting: Cardiovascular Disease

## 2018-12-02 ENCOUNTER — Other Ambulatory Visit: Payer: Self-pay

## 2018-12-02 ENCOUNTER — Telehealth: Payer: BC Managed Care – PPO | Admitting: Physician Assistant

## 2018-12-04 ENCOUNTER — Telehealth: Payer: Self-pay | Admitting: *Deleted

## 2018-12-04 NOTE — Telephone Encounter (Signed)

## 2018-12-04 NOTE — Telephone Encounter (Signed)
Call placed to pt re: appt 12/07/2018 with Vin Bhagat.  Need to change to in-office and ask covid19 prescreen questions.

## 2018-12-07 ENCOUNTER — Telehealth (INDEPENDENT_AMBULATORY_CARE_PROVIDER_SITE_OTHER): Payer: BC Managed Care – PPO | Admitting: Physician Assistant

## 2018-12-07 ENCOUNTER — Encounter: Payer: Self-pay | Admitting: Physician Assistant

## 2018-12-07 ENCOUNTER — Other Ambulatory Visit: Payer: Self-pay

## 2018-12-07 VITALS — BP 121/70 | HR 83 | Ht 66.0 in | Wt 211.0 lb

## 2018-12-07 DIAGNOSIS — I340 Nonrheumatic mitral (valve) insufficiency: Secondary | ICD-10-CM | POA: Diagnosis not present

## 2018-12-07 DIAGNOSIS — R002 Palpitations: Secondary | ICD-10-CM | POA: Diagnosis not present

## 2018-12-07 DIAGNOSIS — Z7189 Other specified counseling: Secondary | ICD-10-CM | POA: Diagnosis not present

## 2018-12-07 NOTE — Patient Instructions (Signed)
Medication Instructions:  Your physician recommends that you continue on your current medications as directed. Please refer to the Current Medication list given to you today.  If you need a refill on your cardiac medications before your next appointment, please call your pharmacy.   Lab work: None ordered  If you have labs (blood work) drawn today and your tests are completely normal, you will receive your results only by: . MyChart Message (if you have MyChart) OR . A paper copy in the mail If you have any lab test that is abnormal or we need to change your treatment, we will call you to review the results.  Testing/Procedures: None ordered   Follow-Up: At [facility], you and your health needs are our priority.  As part of our continuing mission to provide you with exceptional heart care, we have created designated Provider Care Teams.  These Care Teams include your primary Cardiologist (physician) and Advanced Practice Providers (APPs -  Physician Assistants and Nurse Practitioners) who all work together to provide you with the care you need, when you need it. You will need a follow up appointment in:  12 months.  Please call our office 2 months in advance to schedule this appointment.  You may see Philip [name], MD or one of the following Advanced Practice Providers on your designated Care Team: Scott Weaver, PA-C Vin Bhagat, PA-C . Janine Hammond, NP  Any Other Special Instructions Will Be Listed Below (If Applicable).    

## 2018-12-07 NOTE — Progress Notes (Signed)
Virtual Visit via Video Note   This visit type was conducted due to national recommendations for restrictions regarding the COVID-19 Pandemic (e.g. social distancing) in an effort to limit this patient's exposure and mitigate transmission in our community.  Due to her co-morbid illnesses, this patient is at least at moderate risk for complications without adequate follow up.  This format is felt to be most appropriate for this patient at this time.  All issues noted in this document were discussed and addressed.  A limited physical exam was performed with this format.  Please refer to the patient's chart for her consent to telehealth for [facility].   Date:  12/07/2018   ID:  Rebekah Bates Levesque, DOB [birth year], MRN [id number]  Patient Location: Home Provider Location: Office  PCP:  Shirlean MylarWebb, Carol, MD  Cardiologist:  Kristeen MissPhilip [name], MD  Evaluation Performed:  Follow-Up Visit  Chief Complaint:  Yearly follow up  History of Present Illness:    Rebekah Bates Aydt is a 53 y.o. female with hx of palpitations and mild mitral regurgitation seen for follow up.   Echo 06/2015 showed LVEF of 55-60%, mild MR, PA pressure of 36mm Hg. Monitor 06/2015 with NSR, no significant arrhythmias.   Last seen by Dr. Elease HashimotoNahser 12/2017. Intermittent palpitations>> felt like PVCs.   Seen virtually for follow up.  Her palpitation is well controlled on PRN propranolol.  She required to take once or twice per month.  Did not associated with caffeine intake.  Likely stress related.  She denies chest pain, shortness of breath, orthopnea, PND, syncope, lower extremity edema or melena.   The patient does not have symptoms concerning for COVID-19 infection (fever, chills, cough, or new shortness of breath).    Past Medical History:  Diagnosis Date  . Anxiety   . Mild mitral regurgitation   . Palpitations    No past surgical history on file.   Current Meds  Medication Sig  . clindamycin (CLINDAGEL) 1 % gel Apply 1  application topically daily.  . clonazePAM (KLONOPIN) 0.5 MG tablet Take 0.5 mg by mouth 2 (two) times daily as needed for anxiety. Reported on 06/20/2015  . cyclobenzaprine (FLEXERIL) 10 MG tablet Take 10 mg by mouth 3 (three) times daily as needed for muscle spasms.  . diclofenac (VOLTAREN) 25 MG EC tablet Take 25 mg by mouth 2 (two) times daily.  . furosemide (LASIX) 40 MG tablet TAKE 1 TABLET BY MOUTH TWICE A WEEK AS NEEDED FOR FLUID  . KURVELO 0.15-30 MG-MCG tablet Take 1 tablet by mouth daily.  Marland Kitchen. oxyCODONE-acetaminophen (PERCOCET) 7.5-325 MG tablet Take 1 tablet by mouth every 4 (four) hours as needed for moderate pain or severe pain.  Marland Kitchen. propranolol (INDERAL) 10 MG tablet Take 1 tablet (10 mg total) by mouth 4 (four) times daily as needed.  . valACYclovir (VALTREX) 500 MG tablet TAKE 1 TABLET BY MOUTH EVERY 12 HOURS FOR 3 DAYS AS NEEDED     Allergies:   Patient has no known allergies.   Social History   Tobacco Use  . Smoking status: Never Smoker  . Smokeless tobacco: Never Used  Substance Use Topics  . Alcohol use: No  . Drug use: No     Family Hx: The patient's family history includes Diabetes in her father; Hypertension in her father and mother.  ROS:   Please see the history of present illness.    All other systems reviewed and are negative.   Prior CV studies:   The following  studies were reviewed today:  As above  Labs/Other Tests and Data Reviewed:    EKG:  No ECG reviewed.  Recent Labs: No results found for requested labs within last 8760 hours.   Recent Lipid Panel No results found for: CHOL, TRIG, HDL, CHOLHDL, LDLCALC, LDLDIRECT  Wt Readings from Last 3 Encounters:  12/07/18 211 lb (95.7 kg)  12/11/17 213 lb (96.6 kg)  09/08/17 210 lb (95.3 kg)     Objective:    Vital Signs:  BP 121/70   Pulse 83   Ht 5\' 6"  (1.676 m)   Wt 211 lb (95.7 kg)   BMI 34.06 kg/m    VITAL SIGNS:  reviewed GEN:  no acute distress EYES:  sclerae anicteric, EOMI -  Extraocular Movements Intact RESPIRATORY:  normal respiratory effort, symmetric expansion CARDIOVASCULAR:  no peripheral edema SKIN:  no rash, lesions or ulcers. MUSCULOSKELETAL:  no obvious deformities. NEURO:  alert and oriented x 3, no obvious focal deficit PSYCH:  normal affect  ASSESSMENT & PLAN:    1. Palpitations  -Well-controlled.  Continue PRN propranolol.  No further work-up needed.  COVID-19 Education: The signs and symptoms of COVID-19 were discussed with the patient and how to seek care for testing (follow up with PCP or arrange E-visit).  The importance of social distancing was discussed today.  Time:   Today, I have spent 6 minutes with the patient with telehealth technology discussing the above problems.     Medication Adjustments/Labs and Tests Ordered: Current medicines are reviewed at length with the patient today.  Concerns regarding medicines are outlined above.   Tests Ordered: No orders of the defined types were placed in this encounter.   Medication Changes: No orders of the defined types were placed in this encounter.   Follow Up:  In Person in 1 year(s)  Signed, Leanor Kail, [place]  12/07/2018 2:37 PM    [place]

## 2019-01-18 ENCOUNTER — Other Ambulatory Visit: Payer: Self-pay | Admitting: Cardiovascular Disease

## 2019-01-20 ENCOUNTER — Other Ambulatory Visit: Payer: Self-pay

## 2019-01-20 MED ORDER — PROPRANOLOL HCL 10 MG PO TABS: 10.0000 mg | ORAL_TABLET | Freq: Four times a day (QID) | ORAL | 11 refills | Status: DC | PRN

## 2019-01-21 ENCOUNTER — Other Ambulatory Visit: Payer: Self-pay

## 2019-01-21 ENCOUNTER — Ambulatory Visit: Payer: BC Managed Care – PPO | Admitting: Cardiology

## 2019-01-21 ENCOUNTER — Telehealth: Payer: Self-pay | Admitting: Cardiovascular Disease

## 2019-01-21 ENCOUNTER — Encounter: Payer: Self-pay | Admitting: Cardiology

## 2019-01-21 VITALS — BP 106/72 | HR 60 | Ht 66.0 in | Wt 211.4 lb

## 2019-01-21 DIAGNOSIS — F419 Anxiety disorder, unspecified: Secondary | ICD-10-CM | POA: Diagnosis not present

## 2019-01-21 DIAGNOSIS — I340 Nonrheumatic mitral (valve) insufficiency: Secondary | ICD-10-CM | POA: Diagnosis not present

## 2019-01-21 DIAGNOSIS — R002 Palpitations: Secondary | ICD-10-CM | POA: Diagnosis not present

## 2019-01-21 MED ORDER — PROPRANOLOL HCL ER 60 MG PO CP24: 60.0000 mg | ORAL_CAPSULE | Freq: Every day | ORAL | 3 refills | Status: DC

## 2019-01-21 NOTE — Telephone Encounter (Signed)
Pt called to report that she has been having palpitations for 3 weeks but they sem to be worsening.. she has tried taking the Inderol but it does not seem to be helping. She feels it in her throat and she has light headedness with them. She  Says they are happening all day and becoming very frequent.   Will talk with Dr. Audrea Muscat re: making her an appt to do an EKG.   Pt to be seen by Kathyrn Drown NP today.       COVID-19 Pre-Screening Questions:  . In the past 7 to 10 days have you had a cough,  shortness of breath, headache, congestion, fever (100 or greater) body aches, chills, sore throat, or sudden loss of taste or sense of smell? NO . Have you been around anyone with known Covid 19.NO . Have you been around anyone who is awaiting Covid 19 test results in the past 7 to 10 days?NO . Have you been around anyone who has been exposed to Covid 19, or has mentioned symptoms of Covid 19 within the past 7 to 10 days?NO  If you have any concerns/questions about symptoms patients report during screening (either on the phone or at threshold). Contact the provider seeing the patient or DOD for further guidance.  If neither are available contact a member of the leadership team.

## 2019-01-21 NOTE — Progress Notes (Signed)
Cardiology Office Note   Date:  01/21/2019   ID:  Rebekah Bates, DOB 1965-12-31, MRN [id number]  PCP:  Shirlean MylarWebb, Carol, MD  Cardiologist:  Dr. Elease HashimotoNahser, MD  Chief Complaint  Patient presents with  . Palpitations     History of Present Illness: Rebekah Bates is a 53 y.o. female with a history of palpitations and mitral regurgitation who was last seen by our service 12/07/2018 for yearly follow-up.  She had an echocardiogram 06/2015 which showed LVEF of 55 to 60%, mild MR, PA pressure of 36 mmHg. She wore a monitor at that time which showed NSR and no significant arrhythmias.  She was last seen by Dr. Elease HashimotoNahser 12/2017 with reports of intermittent palpitations felt to be PVCs.  On her last visit, palpitations felt to be well controlled on as needed propanolol.  She was requiring to take this 1-2 times per month, thought to be stress related.  She had no chest pain, shortness of breath, orthopnea, PND, syncope or LE swelling.  Today she presents with worsening palpitations, now happening multiple times a day, every day of the week.  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  States that she has anxiety as well however she can typically discern between her anxiety and palpitations. She denies chest pain, shortness of breath however does have some mild dizziness when they occur. Duration of palpitations are about 10 to 30 seconds.  She is only taking her propanolol as needed once daily with resolution.  She reports being intolerant to metoprolol in the past as it caused overwhelming fatigue.  She reports TSH and other lab work per PCP recently that was all within normal limits.  She wore a monitor as stated above previously with no documented arrhythmias.  We will place 1 week ZIO patch to reassess for arrhythmias, AF or SVT.  In the meantime will prescribe long-acting propanolol with close follow-up.  Past Medical  History:  Diagnosis Date  . Anxiety   . Mild mitral regurgitation   . Palpitations     History reviewed. No pertinent surgical history.   Current Outpatient Medications  Medication Sig Dispense Refill  . clindamycin (CLINDAGEL) 1 % gel Apply 1 application topically daily.  11  . clonazePAM (KLONOPIN) 0.5 MG tablet Take 0.5 mg by mouth 2 (two) times daily as needed for anxiety. Reported on 06/20/2015    . cyclobenzaprine (FLEXERIL) 10 MG tablet Take 10 mg by mouth 3 (three) times daily as needed for muscle spasms.    . diclofenac (VOLTAREN) 25 MG EC tablet Take 25 mg by mouth 2 (two) times daily.    . furosemide (LASIX) 40 MG tablet TAKE 1 TABLET BY MOUTH TWICE A WEEK AS NEEDED FOR FLUID  11  . KURVELO 0.15-30 MG-MCG tablet Take 1 tablet by mouth daily.  11  . oxyCODONE-acetaminophen (PERCOCET) 7.5-325 MG tablet Take 1 tablet by mouth every 4 (four) hours as needed for moderate pain or severe pain.    . valACYclovir (VALTREX) 500 MG tablet TAKE 1 TABLET BY MOUTH EVERY 12 HOURS FOR 3 DAYS AS NEEDED  2  . propranolol ER (INDERAL LA) 60 MG 24 hr capsule Take 1 capsule (60 mg total) by mouth daily. 90 capsule 3   No current facility-administered medications for this visit.     Allergies:   Patient has no known allergies.    Social History:  The patient  reports that she has never smoked. She has never used smokeless tobacco. She reports that she does not drink alcohol or use drugs.   Family History:  The patient'sfamily history includes Diabetes in her father; Hypertension in her father and mother.    ROS:  Please see the history of present illness. Otherwise, review of systems are positive for none.   All other systems are reviewed and negative.    PHYSICAL EXAM: VS:  BP 106/72   Pulse 60   Ht 5\' 6"  (1.676 m)   Wt 211 lb 6.4 oz (95.9 kg)   SpO2 95%   BMI 34.12 kg/m  , BMI Body mass index is 34.12 kg/m.   General: Well developed, well nourished, NAD Lungs:Clear to ausculation  bilaterally. No wheezes, rales, or rhonchi. Breathing is unlabored. Cardiovascular: RRR with S1 S2. No murmur Extremities: No edema. No clubbing or cyanosis. DP/PT pulses 2+ bilaterally Neuro: Alert and oriented. No focal deficits. No facial asymmetry. MAE spontaneously. Psych: Resp onds to questions appropriately with normal affect.     EKG:  EKG is ordered today. The ekg ordered today demonstrates NSR with no acute ST changes, HR 60 bpm   Recent Labs: No results found for requested labs within last 8760 hours.    Lipid Panel No results found for: CHOL, TRIG, HDL, CHOLHDL, VLDL, LDLCALC, LDLDIRECT    Wt Readings from Last 3 Encounters:  01/21/19 211 lb 6.4 oz (95.9 kg)  12/07/18 211 lb (95.7 kg)  12/11/17 213 lb (96.6 kg)     Other studies Reviewed: Additional studies/ records that were reviewed today include:   Non-telemetry monitoring 06/26/2015: -NSR, no significant arrhythmias   Echocardiogram 06/30/2015: Study Conclusions  - Left ventricle: The cavity size was normal. Systolic function was   normal. The estimated ejection fraction was in the range of 55%   to 60%. Wall motion was normal; there were no regional wall   motion abnormalities. Left ventricular diastolic function   parameters were normal. - Aortic valve: Trileaflet; normal thickness leaflets. There was no   regurgitation. - Mitral valve: There was mild regurgitation. - Left atrium: The atrium was normal in size. - Right ventricle: Systolic function was normal. - Right atrium: The atrium was normal in size. - Tricuspid valve: There was mild regurgitation. - Pulmonary arteries: Systolic pressure was mildly increased. PA   peak pressure: 36 mm Hg (S). - Inferior vena cava: The vessel was normal in size. - Pericardium, extracardiac: There was no pericardial effusion.  ASSESSMENT AND PLAN:  1.  Palpitations: -Last seen 12/07/2018 via virtual visit and palpitations noted to be well controlled on as  needed propanolol 10 mg 4 times per day as needed -Noted to be intolerant to metoprolol in the past secondary to fatigue therefore we will plan for long-acting propanolol for now.  Given the frequency of her palpitations will place a 1 week ZIO patch and follow closely thereafter with results.  -Denies increased caffeine, no energy drinks -EKG, NSR today -Obtain BMET, CBC -Consider echocardiogram if arrhythmias to assess for structural abnormalities>> previous echocardiogram 2017 normal LV function with no wall motion abnormalities  2.  Mild mitral regurgitation: -Seen on echocardiogram, no symptoms -Follow with serial echo studies  3.  Anxiety: -Stable, on Klonopin -Follows with PCP   Current medicines are reviewed at length with the patient today.  The patient does not have concerns regarding medicines.  The following changes have been made: Stop PRN propanolol and start long-acting  propranolol 60 mg daily for palpitations  Labs/ tests ordered today include: 1 week ZIO patch with close follow-up  Orders Placed This Encounter  Procedures  . CBC  . Basic metabolic panel  . LONG TERM MONITOR-LIVE TELEMETRY (3-14 DAYS)  . EKG 12-Lead    Disposition:   FU with APP after monitor results    Signed, Kathyrn Drown, NP  01/21/2019 3:21 PM    [facility] [place], [place], [address]  [address] Phone: [phone]; Fax: [phone]

## 2019-01-21 NOTE — Telephone Encounter (Signed)
New message:    Patient calling stating that she need to see some one today. Patient think she is having AFIB. Patient not sure if her medication is working. Please call patient.

## 2019-01-21 NOTE — Patient Instructions (Addendum)
Medication Instructions:   Your physician has recommended you make the following change in your medication:   1) Stop Propranolol 10MG  as needed 2) Start Propranolol ER 60MG . 1 tablet by mouth once a day.  If you need a refill on your cardiac medications before your next appointment, please call your pharmacy.   Lab work:  You will have labs drawn today: BMET and CBC  If you have labs (blood work) drawn today and your tests are completely normal, you will receive your results only by: Marland Kitchen MyChart Message (if you have MyChart) OR . A paper copy in the mail If you have any lab test that is abnormal or we need to change your treatment, we will call you to review the results.  Testing/Procedures:  An order for a zio monitor was placed today. It will remain on for 7 days. You will then return monitor and event diary in provided box. It takes 1-2 weeks for report to be downloaded and returned to [place]. We will call you with the results. If monitor falls off or has orange flashing light, please call Zio for further instructions.   Follow-Up:  You will follow up after your monitor results come back, our schedulers will call to set up the follow up appointment.

## 2019-01-22 LAB — CBC
Hematocrit: 40.1 % (ref 34.0–46.6)
Hemoglobin: 13 g/dL (ref 11.1–15.9)
MCH: 26.2 pg — ABNORMAL LOW (ref 26.6–33.0)
MCHC: 32.4 g/dL (ref 31.5–35.7)
MCV: 81 fL (ref 79–97)
Platelets: 292 10*3/uL (ref 150–450)
RBC: 4.97 x10E6/uL (ref 3.77–5.28)
RDW: 12.7 % (ref 11.7–15.4)
WBC: 6.1 10*3/uL (ref 3.4–10.8)

## 2019-01-22 LAB — BASIC METABOLIC PANEL
BUN/Creatinine Ratio: 11 (ref 9–23)
BUN: 9 mg/dL (ref 6–24)
CO2: 24 mmol/L (ref 20–29)
Calcium: 10 mg/dL (ref 8.7–10.2)
Chloride: 102 mmol/L (ref 96–106)
Creatinine, Ser: 0.8 mg/dL (ref 0.57–1.00)
GFR calc Af Amer: 99 mL/min/{1.73_m2} (ref >59)
GFR calc non Af Amer: 86 mL/min/{1.73_m2} (ref >59)
Glucose: 86 mg/dL (ref 65–99)
Potassium: 4.3 mmol/L (ref 3.5–5.2)
Sodium: 141 mmol/L (ref 134–144)

## 2019-01-25 ENCOUNTER — Telehealth: Payer: Self-pay | Admitting: Cardiovascular Disease

## 2019-01-25 NOTE — Telephone Encounter (Signed)
Patient returning call for results 

## 2019-01-25 NOTE — Telephone Encounter (Signed)
Notes recorded by Mady Haagensen, CMA on 01/25/2019 at 4:38 PM EDT  The patient has been notified of the result and verbalized understanding. All questions (if any) were answered.  Mady Haagensen, [facility] 01/25/2019 4:38 PM

## 2019-02-05 ENCOUNTER — Other Ambulatory Visit: Payer: Self-pay

## 2019-02-05 ENCOUNTER — Telehealth: Payer: Self-pay

## 2019-02-05 DIAGNOSIS — R002 Palpitations: Secondary | ICD-10-CM

## 2019-02-05 NOTE — Telephone Encounter (Signed)
Spoke to pt, went over brief instructions for her monitor. Verified address. 7 day [employer] Event monitor ordered to be mailed to pt's home.

## 2019-02-13 ENCOUNTER — Ambulatory Visit (INDEPENDENT_AMBULATORY_CARE_PROVIDER_SITE_OTHER): Payer: BC Managed Care – PPO

## 2019-02-13 DIAGNOSIS — R002 Palpitations: Secondary | ICD-10-CM

## 2019-04-02 ENCOUNTER — Telehealth: Payer: Self-pay | Admitting: Nurse Practitioner

## 2019-04-02 MED ORDER — PROPRANOLOL HCL 20 MG PO TABS: 20.0000 mg | ORAL_TABLET | Freq: Two times a day (BID) | ORAL | 3 refills | Status: DC | PRN

## 2019-04-02 NOTE — Telephone Encounter (Signed)
-----   Message from Thayer Headings, MD sent at 04/01/2019  6:42 AM EST ----- Sinus rhythm.  No significant arrhythmias seen

## 2019-04-02 NOTE — Telephone Encounter (Signed)
Reviewed monitor results with patient. She states she is having less episodes of palpitations and feels that the frequency that occurred when she saw Kathyrn Drown, NP on 9/10 were stress related. She states she has not been taking propranolol 60 mg; states she took propranolol 10 mg on one occasion in the last few weeks. She requests a dose of propranolol between 10 and 60 mg that she can take if symptoms reoccur. I advised I will send a new Rx for propranolol 20 mg BID PRN. I advised her to call back if symptoms worsen or with other concerns. She verbalized understanding and agreement with plan and thanked me for the call.

## 2019-04-03 NOTE — Telephone Encounter (Signed)
Agree with plan as outlined by Michelle Swinyer, RN 

## 2019-09-30 ENCOUNTER — Other Ambulatory Visit: Payer: Self-pay | Admitting: Family Medicine

## 2019-09-30 ENCOUNTER — Ambulatory Visit
Admission: RE | Admit: 2019-09-30 | Discharge: 2019-09-30 | Disposition: A | Discharge status: Home / Self Care | Payer: BC Managed Care – PPO | Source: Ambulatory Visit | Attending: Family Medicine | Admitting: Family Medicine

## 2019-09-30 DIAGNOSIS — M542 Cervicalgia: Secondary | ICD-10-CM

## 2019-09-30 DIAGNOSIS — M545 Low back pain, unspecified: Secondary | ICD-10-CM

## 2021-05-09 ENCOUNTER — Other Ambulatory Visit: Payer: Self-pay | Admitting: Family Medicine

## 2021-05-09 DIAGNOSIS — Z1231 Encounter for screening mammogram for malignant neoplasm of breast: Secondary | ICD-10-CM

## 2021-05-10 ENCOUNTER — Ambulatory Visit
Admission: RE | Admit: 2021-05-10 | Discharge: 2021-05-10 | Disposition: A | Discharge status: Home / Self Care | Payer: 59 | Source: Ambulatory Visit

## 2021-05-10 ENCOUNTER — Other Ambulatory Visit: Payer: Self-pay | Admitting: Family Medicine

## 2021-05-10 DIAGNOSIS — Z1231 Encounter for screening mammogram for malignant neoplasm of breast: Secondary | ICD-10-CM

## 2021-11-04 ENCOUNTER — Encounter: Payer: Self-pay | Admitting: Cardiovascular Disease

## 2021-11-05 ENCOUNTER — Ambulatory Visit: Payer: Managed Care, Other (non HMO) | Admitting: Cardiovascular Disease

## 2021-11-05 ENCOUNTER — Other Ambulatory Visit: Payer: Self-pay | Admitting: Family Medicine

## 2021-11-05 ENCOUNTER — Encounter: Payer: Self-pay | Admitting: Cardiovascular Disease

## 2021-11-05 VITALS — BP 132/78 | HR 74 | Ht 66.0 in | Wt 209.4 lb

## 2021-11-05 DIAGNOSIS — R198 Other specified symptoms and signs involving the digestive system and abdomen: Secondary | ICD-10-CM | POA: Diagnosis not present

## 2021-11-05 DIAGNOSIS — K439 Ventral hernia without obstruction or gangrene: Secondary | ICD-10-CM

## 2021-11-05 MED ORDER — PROPRANOLOL HCL 20 MG PO TABS: 20.0000 mg | ORAL_TABLET | Freq: Two times a day (BID) | ORAL | 3 refills | Status: DC | PRN

## 2021-11-09 ENCOUNTER — Ambulatory Visit
Admission: RE | Admit: 2021-11-09 | Discharge: 2021-11-09 | Disposition: A | Discharge status: Home / Self Care | Payer: Managed Care, Other (non HMO) | Source: Ambulatory Visit | Attending: Family Medicine | Admitting: Family Medicine

## 2021-11-09 DIAGNOSIS — K439 Ventral hernia without obstruction or gangrene: Secondary | ICD-10-CM

## 2021-11-19 ENCOUNTER — Ambulatory Visit (HOSPITAL_COMMUNITY)
Admission: RE | Admit: 2021-11-19 | Discharge: 2021-11-19 | Disposition: A | Discharge status: Home / Self Care | Payer: Managed Care, Other (non HMO) | Source: Ambulatory Visit | Attending: Cardiology | Admitting: Cardiology

## 2021-11-19 DIAGNOSIS — R1909 Other intra-abdominal and pelvic swelling, mass and lump: Secondary | ICD-10-CM

## 2021-11-19 DIAGNOSIS — R198 Other specified symptoms and signs involving the digestive system and abdomen: Secondary | ICD-10-CM | POA: Diagnosis present

## 2022-06-02 ENCOUNTER — Encounter (INDEPENDENT_AMBULATORY_CARE_PROVIDER_SITE_OTHER): Payer: Self-pay

## 2022-06-02 ENCOUNTER — Ambulatory Visit (INDEPENDENT_AMBULATORY_CARE_PROVIDER_SITE_OTHER): Payer: Commercial Managed Care - POS

## 2022-06-02 VITALS — BP 142/83 | HR 92 | Temp 98.7°F | Resp 16 | Ht 66.0 in | Wt 200.0 lb

## 2022-06-02 DIAGNOSIS — I10 Essential (primary) hypertension: Secondary | ICD-10-CM

## 2022-06-02 DIAGNOSIS — J0100 Acute maxillary sinusitis, unspecified: Secondary | ICD-10-CM

## 2022-06-02 MED ORDER — AZELASTINE-FLUTICASONE 137-50 MCG/ACT NA SUSP
1.0000 | Freq: Two times a day (BID) | NASAL | 0 refills | Status: AC
Start: 2022-06-02 — End: ?

## 2022-06-02 MED ORDER — DOXYCYCLINE HYCLATE 100 MG PO TABS
100.0000 mg | ORAL_TABLET | Freq: Two times a day (BID) | ORAL | 0 refills | Status: AC
Start: 2022-06-02 — End: 2022-06-12

## 2022-06-02 NOTE — Progress Notes (Signed)
Urgent Care Provider Note    Patient: Kathy Sullivan   Date: 06/02/2022   MRN: [id number]       Subjective     Chief Complaint   Patient presents with    Sinus Problem     C/O x5 days sinus congestion.        HPI:  Presents today with sinus symptoms.  She states her symptoms started last Wednesday with headache, sinus pressure, cough, laryngitis.  She states she has been using Sudafed and NyQuil since that time for her symptoms.  She is now having yellow and green sinus drainage and having trouble expectorating the mucus.  She has not been running a fever.  She does not have a history of allergies.  She is up-to-date on her flu and COVID vaccines.  She has not tested for either because she has not been running a fever or had body aches.  Her respirations are even and unlabored.  She does demonstrate a loose cough that she is not able to get secretions up.      History provided by:  Patient  Language interpreter used: No    Sinus Problem        Kathy Sullivan is a 57 y.o. female with c/o upper respiratory symptoms that are mild and moderate which started 3 to 5 days and has been gradually worsening. Patient admits to cough, congestion, sore throat, ear ache, and sinus pain and pressure and now green sputum . Patient denies fever, myalgia, chills, SOB, and chest pain.    Pertinent Past Medical, Surgical, Family and Social History were reviewed.      Current Outpatient Medications:     Kurvelo 0.15-30 MG-MCG per tablet, Take 1 tablet by mouth daily, Disp: , Rfl:     No Known Allergies    Medications and Allergies reviewed.         Objective     Vitals:    06/02/22 1040   BP: 142/83   Pulse: 92   Resp: 16   Temp: 98.7 F (37.1 C)   SpO2: 98%     Body mass index is 32.28 kg/m.    Physical Exam    General: well developed, well nourished, no acute distress.     Eyes: No conjunctival injection or discharge.    Ear: TM without erythema, bulging or perforation; normal auditory canals and pinnae; no mastoid  tenderness.    Nose/sinus: Nasal congestion., Rhinorrhea., R maxillary sinus tenderness., and L maxillary sinus tenderness.    Throat: posterior oropharynx erythema without exudates; no peritonsillar bulging, normal, midline uvula; no trismus or drooling.    Neck: No stridor. Normal ROM.    Lung: normal work of breathing, speaking complete sentences, no rales, wheezing or rhonchi. and loose productive cough    Heart: Regular rhythm, no murmurs.    UCC COURSE  There were no labs reviewed with this patient during the visit.    There were no x-rays reviewed with this patient during the visit.    No current facility-administered medications for this visit.             PROCEDURES:  Procedures    MDM:    Sinusitis, Bronchitis, Pharyngitis, Pneumonia, Influenza, COVID-19 and Allergic Rhinitis      Well appearing, VSS, non toxic, stable for dispo home  Start daily nasal lavage followed by Dymista  Doxycycline 1 p.o. twice daily x 10 days  ED precautions reviewed  Follow up with PCP  Patient verbalized understanding of  plan of care and had no further questions/concerns    Assessment         There are no diagnoses linked to this encounter.    Plan and follow-up discussed with patient. See AVS for further documentation.

## 2022-06-02 NOTE — Patient Instructions (Signed)
If you are nauseated or vomiting, drink clear liquids until vomiting subsides (clear broths, sports drinks/Pedialyte, clear caffeine-free sodas such as Sprite, Jell-O, and popsicles).  Start with 4 to 8 ounces for adults/older children and 1 ounce or less at a time for small children.  Stay away from caffeinated beverages as they cause dehydration.      You can start taking an over-the-counter antihistamine such as Claritin, Zyrtec, or Xyzal daily to help with your symptoms.  When you wake in the morning, use a saline nasal spray to clear secretions out of your nose and blow your nose.  Repeat this sequence again at bedtime.  Use your Dymista twice daily after the nasal lavage    Please fill your prescriptions and take all medications as directed/prescribed even if you begin to feel better or before you run out of the medicine.  Risks and benefits of medication were discussed during your visit.    Consider using a saline nasal rinse 1-2 times a day for the next 2 to 3 days until your nasal symptoms have improved.  Use only distilled water, sterile water or boiled tap water for a minimum of 8 minutes and cool to room temperature before use.  It may be more comfortable to warm the saline solution a little in the microwave before using, however do not use if water is hot.    Continue supplement treatment with use of warm compresses to sinus areas and/or exposure to steam from a hot shower or bath.  Consider using a humidifier or vaporizer at the bedside.  Use caution if using a vaporizer around small children as steam can cause severe burns.    Monitor your blood pressure daily at home and report any elevations greater than 140/90 to your primary care physician.  Continue to follow a low salt low, sodium diet

## 2022-09-04 IMAGING — MG DIGITAL SCREENING BREAST BILAT IMPLANT W/ TOMO W/ CAD
9 of 12 series · 9 of 28 positions shown · non-contrast
Comparison: Previous exam(s).

CLINICAL DATA: Screening.

EXAM:
DIGITAL SCREENING BILATERAL MAMMOGRAM WITH IMPLANTS, CAD AND
TOMOSYNTHESIS
TECHNIQUE: Bilateral screening digital craniocaudal and mediolateral oblique
mammograms were obtained. Bilateral screening digital breast
tomosynthesis was performed. The images were evaluated with
computer-aided detection. Standard and/or implant displaced views
were performed.

[L CC]
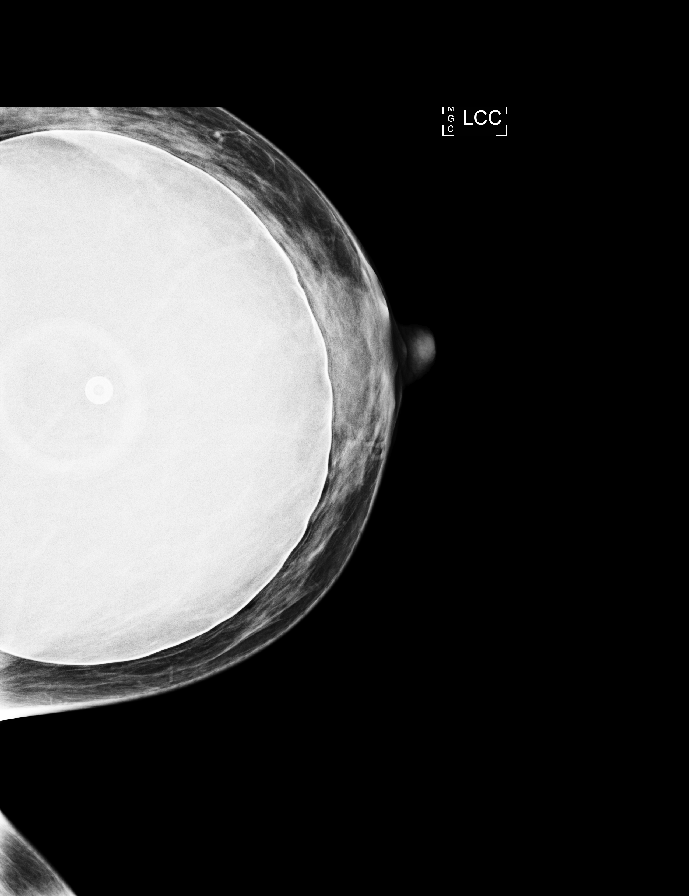

[R CC]
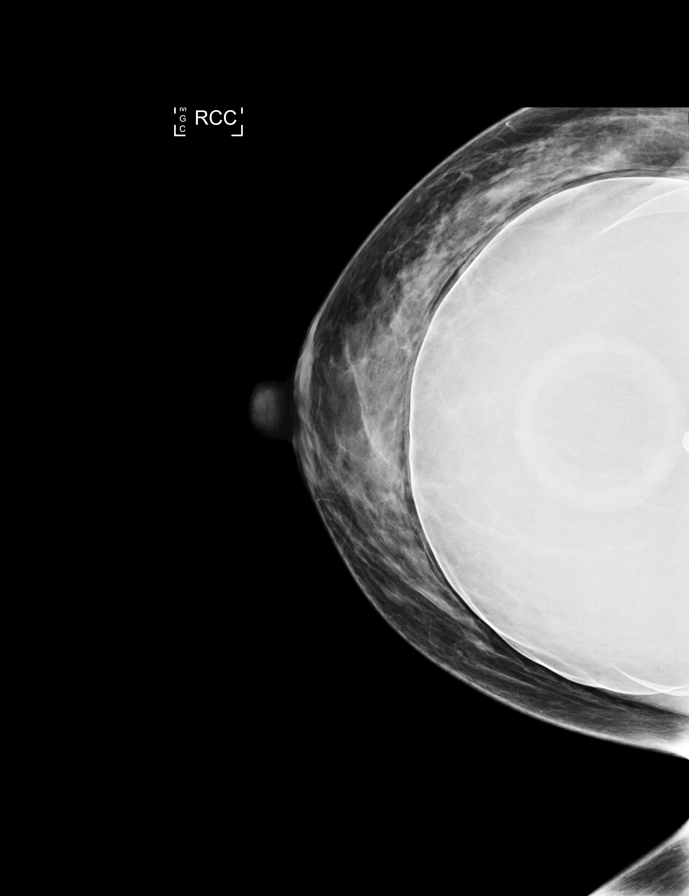

[R MLO]
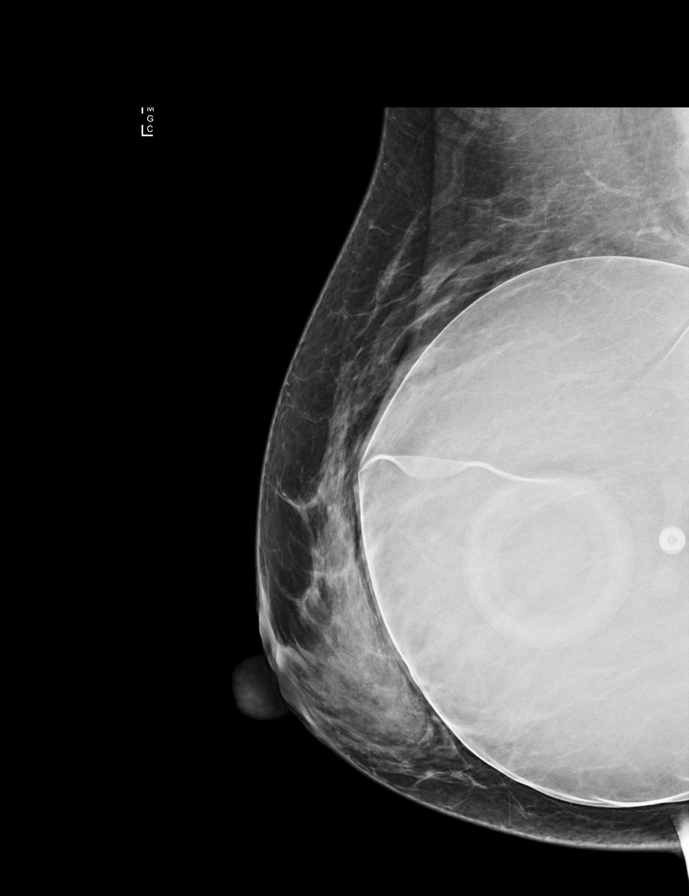

[L MLO]
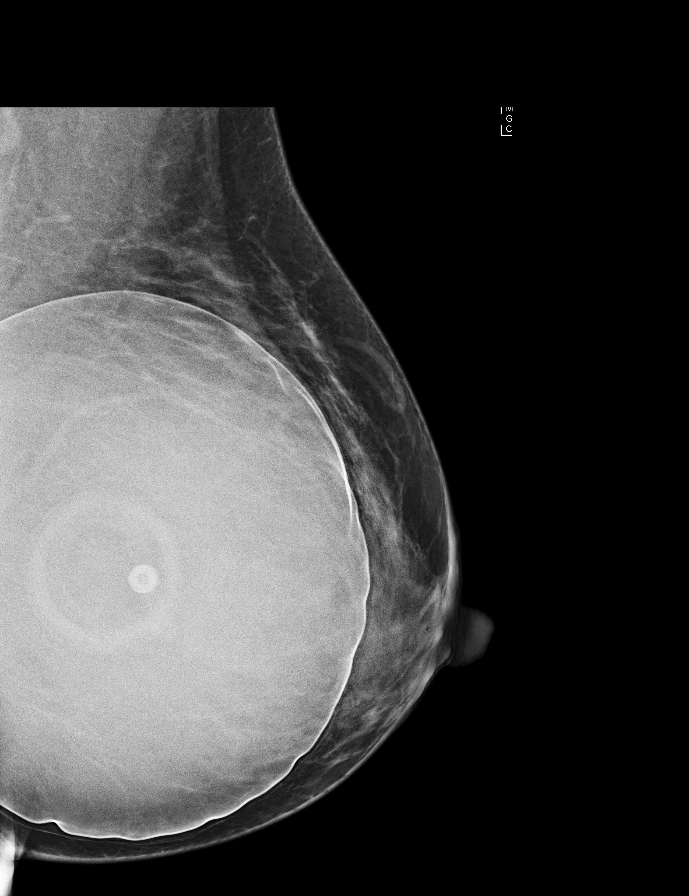

[L CC synth-2D]
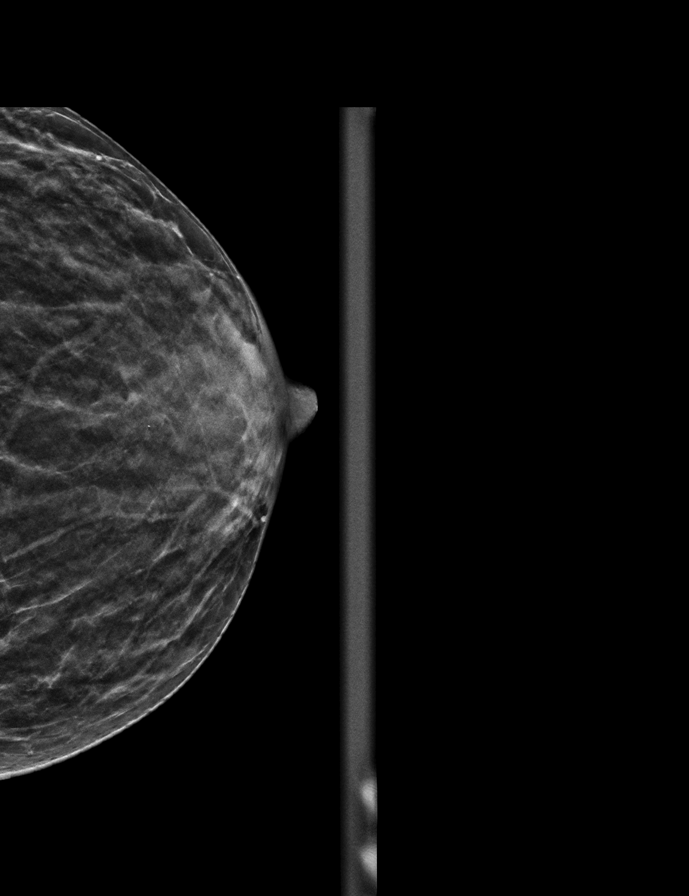

[R MLO synth-2D]
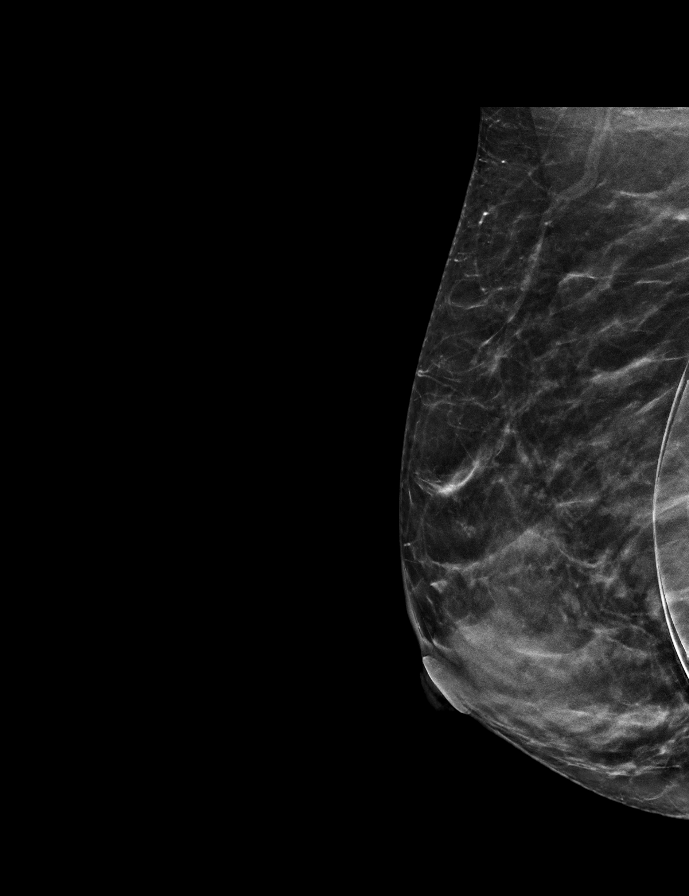

[R CC synth-2D]
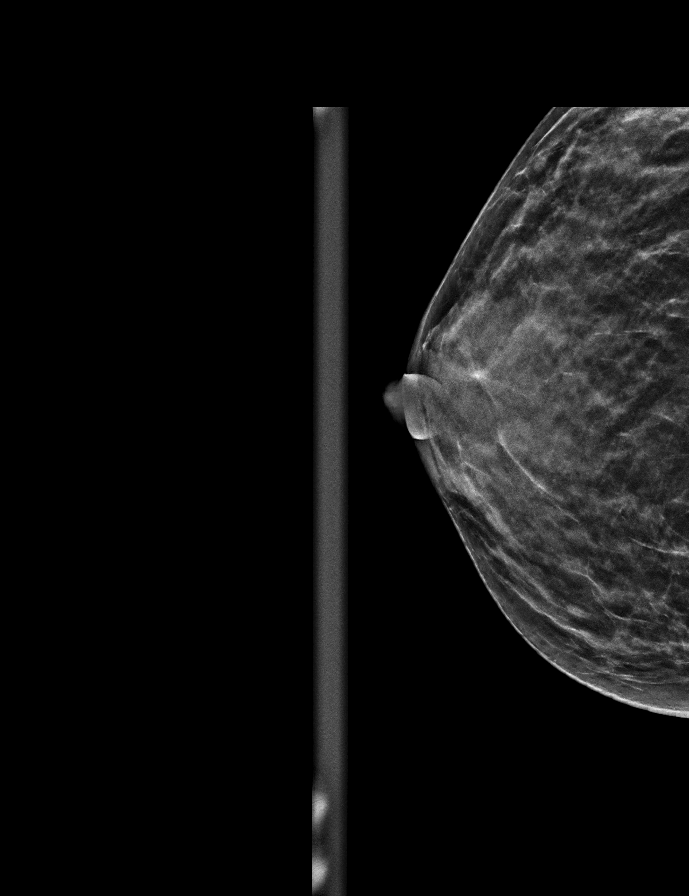

[L MLO synth-2D]
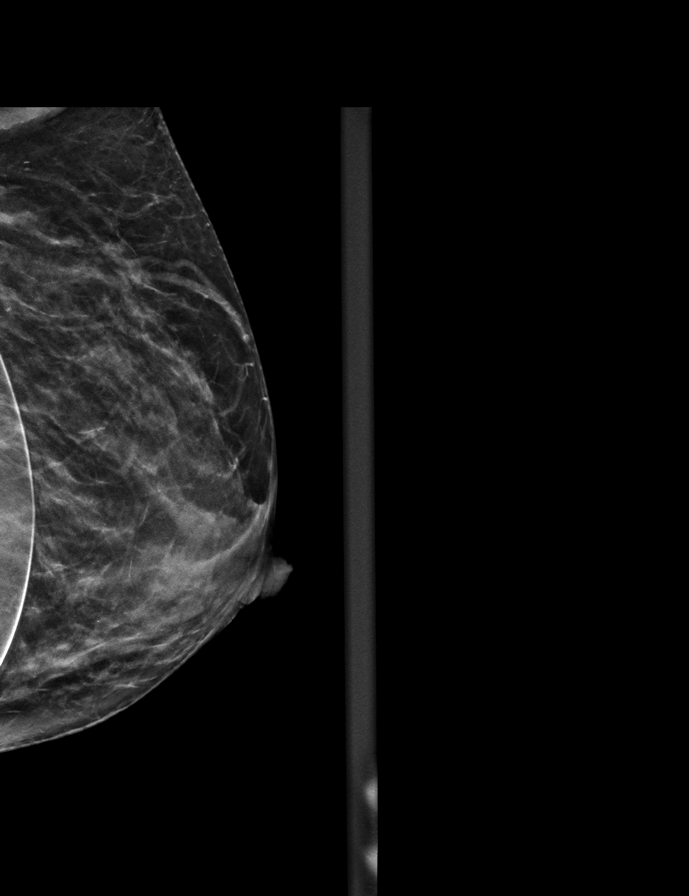

[L MLOID BREAST TOMOSYNTHESIS IMAGE tomo · tomo slice 23/44.0]
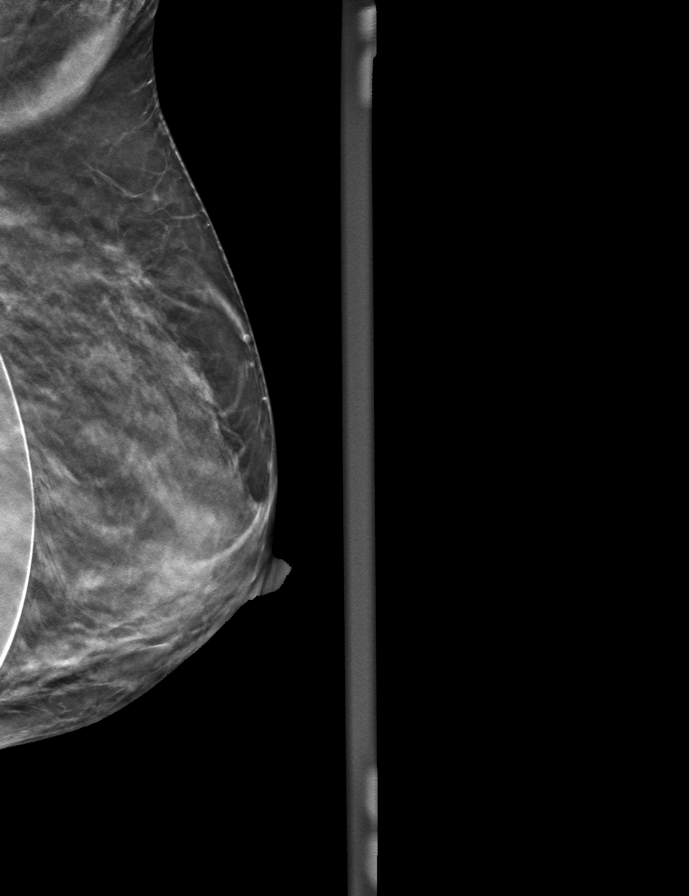

[9 of 28 positions shown; findings below may reference images not displayed]

ACR Breast Density Category c: The breast tissue is heterogeneously
dense, which may obscure small masses.
FINDINGS: The patient has retropectoral implants. There are no findings
suspicious for malignancy.
IMPRESSION: No mammographic evidence of malignancy. A result letter of this
screening mammogram will be mailed directly to the patient.

RECOMMENDATION:
Screening mammogram in one year. (Code:LT-E-7TH)

BI-RADS CATEGORY  1:  Negative.

## 2022-11-07 ENCOUNTER — Ambulatory Visit
Admission: RE | Admit: 2022-11-07 | Discharge: 2022-11-07 | Disposition: A | Discharge status: Home / Self Care | Payer: Managed Care, Other (non HMO) | Source: Ambulatory Visit | Attending: Family Medicine | Admitting: Family Medicine

## 2022-11-07 ENCOUNTER — Other Ambulatory Visit: Payer: Self-pay | Admitting: Family Medicine

## 2022-11-07 DIAGNOSIS — M25461 Effusion, right knee: Secondary | ICD-10-CM

## 2022-11-07 DIAGNOSIS — M25472 Effusion, left ankle: Secondary | ICD-10-CM

## 2023-03-11 ENCOUNTER — Other Ambulatory Visit: Payer: Self-pay | Admitting: Family Medicine

## 2023-03-11 DIAGNOSIS — M5459 Other low back pain: Secondary | ICD-10-CM

## 2023-03-11 DIAGNOSIS — R937 Abnormal findings on diagnostic imaging of other parts of musculoskeletal system: Secondary | ICD-10-CM

## 2023-03-25 ENCOUNTER — Ambulatory Visit
Admission: RE | Admit: 2023-03-25 | Discharge: 2023-03-25 | Disposition: A | Discharge status: Home / Self Care | Payer: Managed Care, Other (non HMO) | Source: Ambulatory Visit | Attending: Family Medicine | Admitting: Family Medicine

## 2023-03-25 ENCOUNTER — Encounter: Payer: Self-pay | Admitting: Plastic Surgery

## 2023-03-25 ENCOUNTER — Other Ambulatory Visit: Payer: Self-pay | Admitting: Physician Assistant

## 2023-03-25 ENCOUNTER — Ambulatory Visit: Payer: Managed Care, Other (non HMO) | Admitting: Plastic Surgery

## 2023-03-25 VITALS — BP 148/77 | HR 75 | Ht 66.0 in | Wt 207.8 lb

## 2023-03-25 DIAGNOSIS — M5459 Other low back pain: Secondary | ICD-10-CM

## 2023-03-25 DIAGNOSIS — R937 Abnormal findings on diagnostic imaging of other parts of musculoskeletal system: Secondary | ICD-10-CM

## 2023-03-25 DIAGNOSIS — R600 Localized edema: Secondary | ICD-10-CM

## 2023-03-25 DIAGNOSIS — S83289A Other tear of lateral meniscus, current injury, unspecified knee, initial encounter: Secondary | ICD-10-CM

## 2023-03-25 NOTE — Progress Notes (Signed)
Patient ID: Rebekah Bates, female    DOB: 1965-07-13, 57 y.o.   MRN: [id number]   Chief Complaint  Patient presents with   Advice Only   Breast Problem    The patient is a 57 year old female here for evaluation of her thighs and lower legs.  She is 5 feet 6 inches tall and weighs 207 pounds.  She is not on a blood thinner and does not have diabetes.  She has a past medical history positive for anxiety and a past surgical history positive for breast augmentation.  The patient states that she has a very heavy feeling and pain in her legs.  Makes it difficult to be active and get around.  She does have some orthopedic issues and is being seen by orthopedics.  She also noticed some fatty lumps in her legs.  They are very deep and hard to identify or isolate.  Does not seem to have any pitting edema in her lower extremities.  Her body habitus seems to be 1 where she deposits her fat in the thigh area.  She has tried some weight reduction regimens in the past but currently not on any particular program.     Review of Systems  Constitutional: Negative.   HENT: Negative.    Eyes: Negative.   Respiratory: Negative.    Cardiovascular:  Positive for leg swelling.  Gastrointestinal: Negative.   Endocrine: Negative.   Genitourinary: Negative.   Musculoskeletal:  Positive for myalgias.    Past Medical History:  Diagnosis Date   Anxiety    Mild mitral regurgitation    Palpitations     Past Surgical History:  Procedure Laterality Date   AUGMENTATION MAMMAPLASTY Bilateral    2014      Current Outpatient Medications:    clindamycin (CLINDAGEL) 1 % gel, Apply 1 application topically daily., Disp: , Rfl: 11   clonazePAM (KLONOPIN) 0.5 MG tablet, Take 0.5 mg by mouth 2 (two) times daily as needed for anxiety. Reported on 06/20/2015, Disp: , Rfl:    cyclobenzaprine (FLEXERIL) 10 MG tablet, Take 10 mg by mouth 3 (three) times daily as needed for muscle spasms., Disp: , Rfl:    cyclobenzaprine  (FLEXERIL) 5 MG tablet, Take 5 mg by mouth at bedtime as needed., Disp: , Rfl:    diclofenac (VOLTAREN) 25 MG EC tablet, Take 25 mg by mouth 2 (two) times daily., Disp: , Rfl:    diclofenac (VOLTAREN) 75 MG EC tablet, Take 75 mg by mouth daily as needed., Disp: , Rfl:    furosemide (LASIX) 40 MG tablet, TAKE 1 TABLET BY MOUTH TWICE A WEEK AS NEEDED FOR FLUID, Disp: , Rfl: 11   KURVELO 0.15-30 MG-MCG tablet, Take 1 tablet by mouth daily., Disp: , Rfl: 11   oxyCODONE-acetaminophen (PERCOCET) 7.5-325 MG tablet, Take 1 tablet by mouth every 4 (four) hours as needed for moderate pain or severe pain., Disp: , Rfl:    propranolol (INDERAL) 20 MG tablet, Take 1 tablet (20 mg total) by mouth 2 (two) times daily as needed., Disp: 180 tablet, Rfl: 3   RYBELSUS 7 MG TABS, Take 1 tablet by mouth every morning., Disp: , Rfl:    valACYclovir (VALTREX) 500 MG tablet, TAKE 1 TABLET BY MOUTH EVERY 12 HOURS FOR 3 DAYS AS NEEDED, Disp: , Rfl: 2   Vitamin D, Ergocalciferol, (DRISDOL) 1.25 MG (50000 UNIT) CAPS capsule, Take 50,000 Units by mouth once a week., Disp: , Rfl:    Objective:  Vitals:   03/25/23 1033  BP: (!) 148/77  Pulse: 75  SpO2: 100%    Physical Exam Vitals and nursing note reviewed.  Constitutional:      Appearance: Normal appearance.  HENT:     Head: Normocephalic and atraumatic.  Cardiovascular:     Rate and Rhythm: Normal rate.     Pulses: Normal pulses.  Pulmonary:     Effort: Pulmonary effort is normal.  Abdominal:     Palpations: Abdomen is soft.  Musculoskeletal:        General: Swelling and tenderness present. No signs of injury.  Skin:    General: Skin is warm.     Capillary Refill: Capillary refill takes less than 2 seconds.     Coloration: Skin is not jaundiced or pale.     Findings: Lesion present. No bruising or erythema.  Neurological:     Mental Status: She is alert and oriented to person, place, and time.  Psychiatric:        Mood and Affect: Mood normal.         Behavior: Behavior normal.        Thought Content: Thought content normal.        Judgment: Judgment normal.     Assessment & Plan:  Lipedema of lower extremity  Liposuction would help the overall amount of fat but would not help the lipedema or removal of the particular lesions that the patient finds more sensitive.  It could actually make things worse.  In my view I would not attempt this.  If the lesions were more identifiable they could be removed.  My recommendation would be for the patient to get on a weight reduction regiment so that her overall fat content could be decreased.  This would allow the lesions to be better identified.  This is a thought but I do not think surgery at this time is an option for her.  I have made a referral to the [facility].  Alena Bills Christino Mcglinchey, DO

## 2023-03-25 NOTE — Addendum Note (Signed)
Addended by: Sherol Dade on: 03/25/2023 11:45 AM   Modules accepted: Orders

## 2023-03-30 ENCOUNTER — Ambulatory Visit (INDEPENDENT_AMBULATORY_CARE_PROVIDER_SITE_OTHER): Payer: Commercial Managed Care - POS | Admitting: Family

## 2023-03-30 ENCOUNTER — Encounter (INDEPENDENT_AMBULATORY_CARE_PROVIDER_SITE_OTHER): Payer: Self-pay

## 2023-03-30 VITALS — BP 135/83 | HR 67 | Temp 99.2°F | Resp 16 | Ht 66.0 in | Wt 211.8 lb

## 2023-03-30 DIAGNOSIS — B9689 Other specified bacterial agents as the cause of diseases classified elsewhere: Secondary | ICD-10-CM

## 2023-03-30 DIAGNOSIS — J329 Chronic sinusitis, unspecified: Secondary | ICD-10-CM

## 2023-03-30 MED ORDER — SALINE NASAL SPRAY 0.65 % NA SOLN
1.0000 | NASAL | 0 refills | Status: AC | PRN
Start: 2023-03-30 — End: ?

## 2023-03-30 MED ORDER — FLUTICASONE PROPIONATE 50 MCG/ACT NA SUSP
2.0000 | Freq: Every day | NASAL | 0 refills | Status: AC
Start: 2023-03-30 — End: 2023-05-29

## 2023-03-30 MED ORDER — AMOXICILLIN-POT CLAVULANATE 875-125 MG PO TABS
1.0000 | ORAL_TABLET | Freq: Two times a day (BID) | ORAL | 0 refills | Status: AC
Start: 2023-03-30 — End: 2023-04-06

## 2023-03-30 NOTE — Patient Instructions (Signed)
 Patient advised to take medication as this prescribed.  Use normal saline nasal spray 2-3 times a day.  Use of Flonase as it is directed.  If symptoms worsening seek medical attention immediately.  Follow-up with the primary care/ urgent care in 3 days if

## 2023-03-30 NOTE — Progress Notes (Signed)
 [facility]  URGENT  CARE  PROGRESS NOTE     Patient: Kathy Sullivan   Date: 03/30/2023   MRN: [id number]       Kathy Sullivan is a 57 y.o. female      HISTORY     History obtained from: Patient    Chief Complaint   Patient presents with    Sinus Problem     Pt c/o

## 2023-04-09 ENCOUNTER — Ambulatory Visit
Admission: RE | Admit: 2023-04-09 | Discharge: 2023-04-09 | Disposition: A | Discharge status: Home / Self Care | Payer: Managed Care, Other (non HMO) | Source: Ambulatory Visit | Attending: Physician Assistant | Admitting: Physician Assistant

## 2023-04-09 DIAGNOSIS — S83289A Other tear of lateral meniscus, current injury, unspecified knee, initial encounter: Secondary | ICD-10-CM

## 2023-06-12 ENCOUNTER — Encounter (INDEPENDENT_AMBULATORY_CARE_PROVIDER_SITE_OTHER): Payer: Self-pay

## 2023-06-16 ENCOUNTER — Encounter (INDEPENDENT_AMBULATORY_CARE_PROVIDER_SITE_OTHER): Payer: Managed Care, Other (non HMO) | Admitting: Physician Assistant

## 2023-06-30 ENCOUNTER — Encounter: Payer: Self-pay | Admitting: Bariatrics

## 2023-06-30 ENCOUNTER — Ambulatory Visit: Payer: Managed Care, Other (non HMO) | Admitting: Bariatrics

## 2023-06-30 VITALS — BP 131/84 | HR 64 | Temp 97.9°F | Ht 66.0 in | Wt 212.0 lb

## 2023-06-30 DIAGNOSIS — I340 Nonrheumatic mitral (valve) insufficiency: Secondary | ICD-10-CM

## 2023-06-30 DIAGNOSIS — Z6834 Body mass index (BMI) 34.0-34.9, adult: Secondary | ICD-10-CM

## 2023-06-30 DIAGNOSIS — E669 Obesity, unspecified: Secondary | ICD-10-CM | POA: Diagnosis not present

## 2023-06-30 DIAGNOSIS — E559 Vitamin D deficiency, unspecified: Secondary | ICD-10-CM

## 2023-06-30 DIAGNOSIS — E6609 Other obesity due to excess calories: Secondary | ICD-10-CM

## 2023-06-30 NOTE — Progress Notes (Signed)
Office: [phone]  /  Fax: [phone]   Initial Visit  Rebekah Bates was seen in clinic today to evaluate for obesity. She is interested in losing weight to improve overall health and reduce the risk of weight related complications. She presents today to review program treatment options, initial physical assessment, and evaluation.     She was referred by: Specialist  When asked what else they would like to accomplish? She states: Adopt healthier eating patterns, Improve energy levels and physical activity, and Improve existing medical conditions  When asked how has your weight affected you? She states: Other: inflammation  Some associated conditions: Vitamin D Deficiency and Other: mitral valve regurgitation.   Contributing factors: Family history of obesity and Slow metabolism for age  Weight promoting medications identified: Steroids  Current nutrition plan: Low-carb, High-protein, and Other: Weight Watchers  Current level of physical activity: None and Strength training several times a day.   Current or previous pharmacotherapy: Phentermine  Response to medication: Lost weight and was able to maintain weight loss   Past medical history includes:   Past Medical History:  Diagnosis Date   Anxiety    Mild mitral regurgitation    Palpitations      Objective:   BP 131/84   Pulse 64   Temp 97.9 F (36.6 C)   Ht 5\' 6"  (1.676 m)   Wt 212 lb (96.2 kg)   LMP  (LMP Unknown)   SpO2 100%   BMI 34.22 kg/m  She was weighed on the bioimpedance scale: Body mass index is 34.22 kg/m.  Peak Weight:212 lbs , Body Fat%:41.3%, Visceral Fat Rating:11, Weight trend over the last 12 months: fluctuating.   General:  Alert, oriented and cooperative. Patient is in no acute distress.  Respiratory: Normal respiratory effort, no problems with respiration noted  Extremities: Normal range of motion.    Mental Status: Normal mood and affect. Normal behavior. Normal judgment and  thought content.   DIAGNOSTIC DATA REVIEWED:  BMET    Component Value Date/Time   NA 141 01/21/2019 1445   K 4.3 01/21/2019 1445   CL 102 01/21/2019 1445   CO2 24 01/21/2019 1445   GLUCOSE 86 01/21/2019 1445   GLUCOSE 81 06/20/2015 0934   BUN 9 01/21/2019 1445   CREATININE 0.80 01/21/2019 1445   CREATININE 0.78 06/20/2015 0934   CALCIUM 10.0 01/21/2019 1445   GFRNONAA 86 01/21/2019 1445   GFRAA 99 01/21/2019 1445   No results found for: "HGBA1C" No results found for: "INSULIN" CBC    Component Value Date/Time   WBC 6.1 01/21/2019 1445   WBC 5.4 09/10/2007 1800   RBC 4.97 01/21/2019 1445   RBC 5.03 09/10/2007 1800   HGB 13.0 01/21/2019 1445   HCT 40.1 01/21/2019 1445   PLT 292 01/21/2019 1445   MCV 81 01/21/2019 1445   MCH 26.2 (L) 01/21/2019 1445   MCHC 32.4 01/21/2019 1445   MCHC 32.5 09/10/2007 1800   RDW 12.7 01/21/2019 1445   Iron/TIBC/Ferritin/ %Sat No results found for: "IRON", "TIBC", "FERRITIN", "IRONPCTSAT" Lipid Panel  No results found for: "CHOL", "TRIG", "HDL", "CHOLHDL", "VLDL", "LDLCALC", "LDLDIRECT" Hepatic Function Panel  No results found for: "PROT", "ALBUMIN", "AST", "ALT", "ALKPHOS", "BILITOT", "BILIDIR", "IBILI"    Component Value Date/Time   TSH 0.901 09/08/2017 1506     Assessment and Plan:   Vitamin D Deficiency She has a history of vitamin D deficiency I do not have an up-to-date vitamin D level. She is at risk for  vitamin D deficiency secondary to obesity.  Plan: Will check her vitamin D when she comes in for her initial visit.   Mitral valve regurgitation:  She has a history of mitral valve regurgitation without activity restriction.  Plan: She will follow-up with her PCP and and a cardiologist if needed. Exercise as tolerated at this time.   Generalized Obesity: Current BMI 34.22    Obesity Treatment / Action Plan:  Patient will work on garnering support from family and friends to begin weight loss journey. Will work on  eliminating or reducing the presence of highly palatable, calorie dense foods in the home. Will complete provided nutritional and psychosocial assessment questionnaire before the next appointment. Will be scheduled for indirect calorimetry to determine resting energy expenditure in a fasting state.  This will allow [place] to create a reduced calorie, high-protein meal plan to promote loss of fat mass while preserving muscle mass. Counseled on the health benefits of losing 5%-15% of total body weight. Was counseled on nutritional approaches to weight loss and benefits of reducing processed foods and consuming plant-based foods and high quality protein as part of nutritional weight management. Was counseled on pharmacotherapy and role as an adjunct in weight management.   Obesity Education Performed Today:  She was weighed on the bioimpedance scale and results were discussed and documented in the synopsis.  We discussed obesity as a disease and the importance of a more detailed evaluation of all the factors contributing to the disease.  We discussed the importance of long term lifestyle changes which include nutrition, exercise and behavioral modifications as well as the importance of customizing this to her specific health and social needs.  We discussed the benefits of reaching a healthier weight to alleviate the symptoms of existing conditions and reduce the risks of the biomechanical, metabolic and psychological effects of obesity.  Discussed New Patient/Late Arrival, and Cancellation Policies. Patient voiced understanding and allowed to ask questions.   Berniece Pap appears to be in the action stage of change and states they are ready to start intensive lifestyle modifications and behavioral modifications.  30 minutes was spent today on this visit including the above counseling, pre-visit chart review, and post-visit documentation.  Reviewed by clinician on day of visit: allergies,  medications, problem list, medical history, surgical history, family history, social history, and previous encounter notes.    Dierra Riesgo A. Lorretta HarpO.

## 2024-02-16 NOTE — Progress Notes (Signed)
 Cardiology Office Note:   Date:  02/16/2024  ID:  Rebekah Bates, DOB 1966-03-08, MRN [id number] PCP: Dyane Anthony RAMAN, FNP  [facility] Providers Cardiologist:  Aleene Passe, MD (Inactive) { Chief Complaint: No chief complaint on file.     History of Present Illness:   Rebekah Bates is a 58 y.o. female with a PMH of potations and anxiety who presents for follow up ***.  Last seen in clinic by Dr. Passe in 2023 for palpitations.   Past Medical History:  Diagnosis Date   Anxiety    Mild mitral regurgitation    Palpitations      Studies Reviewed:    EKG: ***       Cardiac Studies & Procedures   ______________________________________________________________________________________________     ECHOCARDIOGRAM  ECHOCARDIOGRAM COMPLETE 06/30/2015  Narrative *[facility]* [address] [place], [place] [address] [phone]  ------------------------------------------------------------------- Transthoracic Echocardiography  Patient:    Rebekah, Bates MR #:       [id number] Study Date: 06/30/2015 Gender:     F Age:        58 Height:     167.6 cm Weight:     92.1 kg BSA:        2.1 m^2 Pt. Status: Room:  ATTENDING    Aleene Passe, M.D. REFERRING    Aleene Passe, M.D. SONOGRAPHER  Celestia, Will PERFORMING   [facility], Outpatient ORDERING     [name], [name]  cc:  ------------------------------------------------------------------- LV EF: 55% -   60%  ------------------------------------------------------------------- Indications:      (I34.0).  ------------------------------------------------------------------- History:   PMH:  Palpitations. Acquired from the patient and from the patient&'s chart.  Mitral regurgitation.  Mitral valve disease.  ------------------------------------------------------------------- Study Conclusions  - Left ventricle: The cavity size was normal. Systolic function was normal. The estimated ejection fraction was  in the range of 55% to 60%. Wall motion was normal; there were no regional wall motion abnormalities. Left ventricular diastolic function parameters were normal. - Aortic valve: Trileaflet; normal thickness leaflets. There was no regurgitation. - Mitral valve: There was mild regurgitation. - Left atrium: The atrium was normal in size. - Right ventricle: Systolic function was normal. - Right atrium: The atrium was normal in size. - Tricuspid valve: There was mild regurgitation. - Pulmonary arteries: Systolic pressure was mildly increased. PA peak pressure: 36 mm Hg (S). - Inferior vena cava: The vessel was normal in size. - Pericardium, extracardiac: There was no pericardial effusion.  Transthoracic echocardiography.  M-mode, complete 2D, spectral Doppler, and color Doppler.  Birthdate:  Patient birthdate: 1965-09-15.  Age:  Patient is 58 yr old.  Sex:  Gender: female. BMI: 32.8 kg/m^2.  Blood pressure:     96/70  Patient status: Outpatient.  Study date:  Study date: 06/30/2015. Study time: 08:45 AM.  Location:  [facility]  -------------------------------------------------------------------  ------------------------------------------------------------------- Left ventricle:  The cavity size was normal. Systolic function was normal. The estimated ejection fraction was in the range of 55% to 60%. Wall motion was normal; there were no regional wall motion abnormalities. The transmitral flow pattern was normal. The deceleration time of the early transmitral flow velocity was normal. The pulmonary vein flow pattern was normal. The tissue Doppler parameters were normal. Left ventricular diastolic function parameters were normal.  ------------------------------------------------------------------- Aortic valve:   Trileaflet; normal thickness leaflets. Mobility was not restricted.  Doppler:  Transvalvular velocity was within the normal range. There was no stenosis. There was no  regurgitation.  -------------------------------------------------------------------  Aorta:  Aortic root: The aortic root was normal in size.  ------------------------------------------------------------------- Mitral valve:   Structurally normal valve.   Mobility was not restricted.  Doppler:  Transvalvular velocity was within the normal range. There was no evidence for stenosis. There was mild regurgitation.    Peak gradient (D): 4 mm Hg.  ------------------------------------------------------------------- Left atrium:  The atrium was normal in size.  ------------------------------------------------------------------- Right ventricle:  The cavity size was normal. Wall thickness was normal. Systolic function was normal.  ------------------------------------------------------------------- Pulmonic valve:    Structurally normal valve.   Cusp separation was normal.  Doppler:  Transvalvular velocity was within the normal range. There was no evidence for stenosis. There was no regurgitation.  ------------------------------------------------------------------- Tricuspid valve:   Structurally normal valve.    Doppler: Transvalvular velocity was within the normal range. There was mild regurgitation.  ------------------------------------------------------------------- Pulmonary artery:   The main pulmonary artery was normal-sized. Systolic pressure was mildly increased.  ------------------------------------------------------------------- Right atrium:  The atrium was normal in size.  ------------------------------------------------------------------- Pericardium:  There was no pericardial effusion.  ------------------------------------------------------------------- Systemic veins: Inferior vena cava: The vessel was normal in size.  ------------------------------------------------------------------- Measurements  Left ventricle                           Value        Reference LV  ID, ED, PLAX chordal          (L)     41.8  mm     43 - 52 LV ID, ES, PLAX chordal                  23.8  mm     23 - 38 LV fx shortening, PLAX chordal           43    %      >=29 LV PW thickness, ED                      9.07  mm     --------- IVS/LV PW ratio, ED                      0.98         <=1.3 Stroke volume, 2D                        63    ml     --------- Stroke volume/bsa, 2D                    30    ml/m^2 --------- LV ejection fraction, 1-p A4C            61    %      --------- LV end-diastolic volume, 2-p             80    ml     --------- LV end-systolic volume, 2-p              31    ml     --------- LV ejection fraction, 2-p                61    %      --------- Stroke volume, 2-p                       49    ml     ---------  LV end-diastolic volume/bsa, 2-p         38    ml/m^2 --------- LV end-systolic volume/bsa, 2-p          15    ml/m^2 --------- Stroke volume/bsa, 2-p                   23.3  ml/m^2 --------- LV e&', lateral                           15.1  cm/s   --------- LV E/e&', lateral                         6.21         --------- LV e&', medial                            12.9  cm/s   --------- LV E/e&', medial                          7.27         --------- LV e&', average                           14    cm/s   --------- LV E/e&', average                         6.7          ---------  Ventricular septum                       Value        Reference IVS thickness, ED                        8.93  mm     ---------  LVOT                                     Value        Reference LVOT ID, S                               20    mm     --------- LVOT area                                3.14  cm^2   --------- LVOT ID                                  20    mm     --------- LVOT peak velocity, S                    94    cm/s   --------- LVOT mean velocity, S                    64.5  cm/s   --------- LVOT VTI, S  20.2  cm     --------- LVOT  peak gradient, S                    4     mm Hg  --------- Stroke volume (SV), LVOT DP              63.5  ml     --------- Stroke index (SV/bsa), LVOT DP           30.2  ml/m^2 ---------  Aorta                                    Value        Reference Aortic root ID, ED                       27    mm     --------- Ascending aorta ID, A-P, S               25    mm     ---------  Left atrium                              Value        Reference LA ID, A-P, ES                           31    mm     --------- LA ID/bsa, A-P                           1.47  cm/m^2 <=2.2 LA volume, S                             42    ml     --------- LA volume/bsa, S                         20    ml/m^2 --------- LA volume, ES, 1-p A4C                   44    ml     --------- LA volume/bsa, ES, 1-p A4C               20.9  ml/m^2 --------- LA volume, ES, 1-p A2C                   36    ml     --------- LA volume/bsa, ES, 1-p A2C               17.1  ml/m^2 ---------  Mitral valve                             Value        Reference Mitral E-wave peak velocity              93.8  cm/s   --------- Mitral A-wave peak velocity              66.6  cm/s   --------- Mitral deceleration time         (H)     239  ms     150 - 230 Mitral peak gradient, D                  4     mm Hg  --------- Mitral E/A ratio, peak                   1.4          ---------  Pulmonary arteries                       Value        Reference PA pressure, S, DP               (H)     36    mm Hg  <=30  Tricuspid valve                          Value        Reference Tricuspid regurg peak velocity           286   cm/s   --------- Tricuspid peak RV-RA gradient            33    mm Hg  ---------  Systemic veins                           Value        Reference Estimated CVP                            3     mm Hg  ---------  Right ventricle                          Value        Reference RV pressure, S, DP               (H)     36    mm Hg  <=30 RV  s&', lateral, S                        15.8  cm/s   ---------  Legend: (L)  and  (H)  mark values outside specified reference range.  ------------------------------------------------------------------- Prepared and Electronically Authenticated by  Leim Moose, M.D. 2017-02-17T15:07:58    MONITORS  CARDIAC EVENT MONITOR 03/30/2019  Narrative  Sinus rhythm , including episodes of sinus tachycardia and sinus bradycardia  No significant arrhythmias seen       ______________________________________________________________________________________________      Risk Assessment/Calculations:   {Does this patient have ATRIAL FIBRILLATION?:[id number]} No BP recorded.  {Refresh Note OR Click here to enter BP  :1}***        Physical Exam:     VS:  There were no vitals taken for this visit. ***    Wt Readings from Last 3 Encounters:  06/30/23 212 lb (96.2 kg)  03/25/23 207 lb 12.8 oz (94.3 kg)  11/05/21 209 lb 6.4 oz (95 kg)     GEN: Well nourished, well developed, in no acute distress NECK: No JVD; No carotid bruits CARDIAC: ***RRR, no murmurs, rubs, gallops RESPIRATORY:  Clear to auscultation without rales, wheezing or rhonchi  ABDOMEN: Soft, non-tender, non-distended, normal bowel sounds EXTREMITIES:  Warm and well perfused, no edema; No deformity, 2+ radial pulses PSYCH: Normal mood and  affect   Assessment & Plan       {Are you ordering a CV Procedure (e.g. stress test, cath, DCCV, TEE, etc)?   Press F2        :[id number]}   This note was written with the assistance of a dictation microphone or AI dictation software. Please excuse any typos or grammatical errors.   Signed, Georganna Archer, MD 02/16/2024 8:56 PM    [facility]

## 2024-02-17 ENCOUNTER — Encounter: Payer: Self-pay | Admitting: Student in an Organized Health Care Education/Training Program

## 2024-02-17 ENCOUNTER — Ambulatory Visit: Payer: Self-pay | Admitting: Student in an Organized Health Care Education/Training Program

## 2024-02-17 ENCOUNTER — Ambulatory Visit
Attending: Student in an Organized Health Care Education/Training Program | Admitting: Student in an Organized Health Care Education/Training Program

## 2024-02-17 ENCOUNTER — Telehealth: Payer: Self-pay | Admitting: Student in an Organized Health Care Education/Training Program

## 2024-02-17 ENCOUNTER — Ambulatory Visit (HOSPITAL_COMMUNITY)
Admission: RE | Admit: 2024-02-17 | Discharge: 2024-02-17 | Disposition: A | Discharge status: Home / Self Care | Payer: Self-pay | Source: Ambulatory Visit | Attending: Student in an Organized Health Care Education/Training Program | Admitting: Student in an Organized Health Care Education/Training Program

## 2024-02-17 VITALS — BP 128/60 | HR 68 | Ht 66.0 in | Wt 202.0 lb

## 2024-02-17 DIAGNOSIS — E785 Hyperlipidemia, unspecified: Secondary | ICD-10-CM

## 2024-02-17 DIAGNOSIS — R002 Palpitations: Secondary | ICD-10-CM | POA: Diagnosis not present

## 2024-02-17 DIAGNOSIS — Z136 Encounter for screening for cardiovascular disorders: Secondary | ICD-10-CM | POA: Insufficient documentation

## 2024-02-17 MED ORDER — PROPRANOLOL HCL 20 MG PO TABS: 20.0000 mg | ORAL_TABLET | Freq: Two times a day (BID) | ORAL | 3 refills | Status: AC | PRN

## 2024-02-17 NOTE — Telephone Encounter (Signed)
  Patient saw Dr Floretta today and she saw on MyChart that there was an enlargement on her EKG and she has questions. Please call

## 2024-02-17 NOTE — Assessment & Plan Note (Signed)
 Her palpitations are stable today.  I offered the patient a heart monitor since she feels that her palpitations are slightly more frequent; however, she would rather wait at this time and continue taking her propranolol  as needed.  I am not terribly concerned that her palpitations are pathologic so I believe that this is reasonable.  It could all be driven by anxiety particularly since she has had an unremarkable heart monitor in the past. -Refill propranolol  -CTM - Follow-up in 1 year

## 2024-02-17 NOTE — Telephone Encounter (Signed)
 Called patient and pt in for ma visit today and per pt EKG results were not reviewed. Patient requested to have provider review EKG results and  make recommendations if needed. Made pt aware that this will be forward to provider for review. Pt verbalized an understanding.

## 2024-02-17 NOTE — Patient Instructions (Addendum)
 Medication Instructions:  Your physician recommends that you continue on your current medications as directed. Please refer to the Current Medication list given to you today.  *If you need a refill on your cardiac medications before your next appointment, please call your pharmacy*   Testing/Procedures: Dr. Floretta has ordered a CT coronary calcium score.   Test locations:  [facility] at [facility]  [facility] at [facility]  This is $99 out of pocket.   Coronary CalciumScan A coronary calcium scan is an imaging test used to look for deposits of calcium and other fatty materials (plaques) in the inner lining of the blood vessels of the heart (coronary arteries). These deposits of calcium and plaques can partly clog and narrow the coronary arteries without producing any symptoms or warning signs. This puts a person at risk for a heart attack. This test can detect these deposits before symptoms develop. Tell a health care provider about: Any allergies you have. All medicines you are taking, including vitamins, herbs, eye drops, creams, and over-the-counter medicines. Any problems you or family members have had with anesthetic medicines. Any blood disorders you have. Any surgeries you have had. Any medical conditions you have. Whether you are pregnant or may be pregnant. What are the risks? Generally, this is a safe procedure. However, problems may occur, including: Harm to a pregnant woman and her unborn baby. This test involves the use of radiation. Radiation exposure can be dangerous to a pregnant woman and her unborn baby. If you are pregnant, you generally should not have this procedure done. Slight increase in the risk of cancer. This is because of the radiation involved in the test. What happens before the procedure? No preparation is needed for this procedure. What happens  during the procedure? You will undress and remove any jewelry around your neck or chest. You will put on a hospital gown. Sticky electrodes will be placed on your chest. The electrodes will be connected to an electrocardiogram (ECG) machine to record a tracing of the electrical activity of your heart. A CT scanner will take pictures of your heart. During this time, you will be asked to lie still and hold your breath for 2-3 seconds while a picture of your heart is being taken. The procedure may vary among health care providers and hospitals. What happens after the procedure? You can get dressed. You can return to your normal activities. It is up to you to get the results of your test. Ask your health care provider, or the department that is doing the test, when your results will be ready. Summary A coronary calcium scan is an imaging test used to look for deposits of calcium and other fatty materials (plaques) in the inner lining of the blood vessels of the heart (coronary arteries). Generally, this is a safe procedure. Tell your health care provider if you are pregnant or may be pregnant. No preparation is needed for this procedure. A CT scanner will take pictures of your heart. You can return to your normal activities after the scan is done. This information is not intended to replace advice given to you by your health care provider. Make sure you discuss any questions you have with your health care provider. Document Released: 10/26/2007 Document Revised: 03/18/2016 Document Reviewed: 03/18/2016 Elsevier Interactive Patient Education  2017 [employer].    Follow-Up: At [facility], you and your health needs are our priority.  As part of our continuing mission to provide you with exceptional heart care, our providers are all part of one team.  This team includes your primary Cardiologist (physician) and Advanced Practice Providers or APPs (Physician Assistants and Nurse  Practitioners) who all work together to provide you with the care you need, when you need it.  Your next appointment:   1 year(s)  Provider:   Georganna Archer, MD  /
